# Patient Record
Sex: Male | Born: 1950
Health system: Southern US, Community
[De-identification: ages and names within clinical notes are randomized; demographics above are authoritative.]

## PROBLEM LIST (undated history)

## (undated) DIAGNOSIS — S6720XA Crushing injury of unspecified hand, initial encounter: Secondary | ICD-10-CM

## (undated) DIAGNOSIS — K602 Anal fissure, unspecified: Secondary | ICD-10-CM

## (undated) DIAGNOSIS — E785 Hyperlipidemia, unspecified: Secondary | ICD-10-CM

## (undated) DIAGNOSIS — I10 Essential (primary) hypertension: Secondary | ICD-10-CM

## (undated) DIAGNOSIS — I509 Heart failure, unspecified: Secondary | ICD-10-CM

## (undated) DIAGNOSIS — D234 Other benign neoplasm of skin of scalp and neck: Secondary | ICD-10-CM

## (undated) DIAGNOSIS — E119 Type 2 diabetes mellitus without complications: Secondary | ICD-10-CM

## (undated) DIAGNOSIS — W3400XA Accidental discharge from unspecified firearms or gun, initial encounter: Secondary | ICD-10-CM

## (undated) DIAGNOSIS — E669 Obesity, unspecified: Secondary | ICD-10-CM

## (undated) HISTORY — PX: TONSILLECTOMY: SUR1361

## (undated) HISTORY — DX: Crushing injury of unspecified hand, initial encounter: S67.20XA

## (undated) HISTORY — DX: Accidental discharge from unspecified firearms or gun, initial encounter: W34.00XA

## (undated) HISTORY — DX: Other benign neoplasm of skin of scalp and neck: D23.4

## (undated) HISTORY — DX: Hyperlipidemia, unspecified: E78.5

## (undated) HISTORY — DX: Anal fissure, unspecified: K60.2

## (undated) HISTORY — DX: Obesity, unspecified: E66.9

---
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
## 2001-04-06 ENCOUNTER — Encounter: Payer: Self-pay | Admitting: Emergency Medicine

## 2001-04-06 ENCOUNTER — Emergency Department (HOSPITAL_COMMUNITY): Admission: EM | Admit: 2001-04-06 | Discharge: 2001-04-06 | Payer: Self-pay | Admitting: Emergency Medicine

## 2012-05-14 ENCOUNTER — Encounter (HOSPITAL_COMMUNITY): Payer: Self-pay | Admitting: Emergency Medicine

## 2012-05-14 ENCOUNTER — Emergency Department (HOSPITAL_COMMUNITY): Payer: BC Managed Care – PPO

## 2012-05-14 ENCOUNTER — Inpatient Hospital Stay (HOSPITAL_COMMUNITY)
Admission: EM | Admit: 2012-05-14 | Discharge: 2012-05-15 | DRG: 127 | Disposition: A | Discharge status: Home / Self Care | Payer: BC Managed Care – PPO | Attending: Family Medicine | Admitting: Family Medicine

## 2012-05-14 ENCOUNTER — Other Ambulatory Visit: Payer: Self-pay

## 2012-05-14 DIAGNOSIS — I5021 Acute systolic (congestive) heart failure: Secondary | ICD-10-CM

## 2012-05-14 DIAGNOSIS — F17200 Nicotine dependence, unspecified, uncomplicated: Secondary | ICD-10-CM | POA: Diagnosis present

## 2012-05-14 DIAGNOSIS — E785 Hyperlipidemia, unspecified: Secondary | ICD-10-CM | POA: Diagnosis present

## 2012-05-14 DIAGNOSIS — I509 Heart failure, unspecified: Principal | ICD-10-CM | POA: Diagnosis present

## 2012-05-14 DIAGNOSIS — R3589 Other polyuria: Secondary | ICD-10-CM | POA: Diagnosis not present

## 2012-05-14 DIAGNOSIS — Z7982 Long term (current) use of aspirin: Secondary | ICD-10-CM

## 2012-05-14 DIAGNOSIS — J4489 Other specified chronic obstructive pulmonary disease: Secondary | ICD-10-CM | POA: Diagnosis present

## 2012-05-14 DIAGNOSIS — E669 Obesity, unspecified: Secondary | ICD-10-CM | POA: Diagnosis present

## 2012-05-14 DIAGNOSIS — Z6836 Body mass index (BMI) 36.0-36.9, adult: Secondary | ICD-10-CM

## 2012-05-14 DIAGNOSIS — R7309 Other abnormal glucose: Secondary | ICD-10-CM | POA: Diagnosis not present

## 2012-05-14 DIAGNOSIS — I10 Essential (primary) hypertension: Secondary | ICD-10-CM | POA: Diagnosis present

## 2012-05-14 DIAGNOSIS — J449 Chronic obstructive pulmonary disease, unspecified: Secondary | ICD-10-CM | POA: Diagnosis present

## 2012-05-14 DIAGNOSIS — Z79899 Other long term (current) drug therapy: Secondary | ICD-10-CM

## 2012-05-14 DIAGNOSIS — G4733 Obstructive sleep apnea (adult) (pediatric): Secondary | ICD-10-CM | POA: Diagnosis present

## 2012-05-14 DIAGNOSIS — R358 Other polyuria: Secondary | ICD-10-CM | POA: Diagnosis not present

## 2012-05-14 HISTORY — DX: Type 2 diabetes mellitus without complications: E11.9

## 2012-05-14 HISTORY — DX: Essential (primary) hypertension: I10

## 2012-05-14 HISTORY — DX: Heart failure, unspecified: I50.9

## 2012-05-14 LAB — CBC WITH DIFFERENTIAL/PLATELET
Basophils Absolute: 0 10*3/uL (ref 0.0–0.1)
Basophils Relative: 0 % (ref 0–1)
Eosinophils Absolute: 0 10*3/uL (ref 0.0–0.7)
Eosinophils Relative: 0 % (ref 0–5)
HCT: 42.6 % (ref 39.0–52.0)
Hemoglobin: 14.2 g/dL (ref 13.0–17.0)
Lymphocytes Relative: 19 % (ref 12–46)
Lymphs Abs: 1.1 10*3/uL (ref 0.7–4.0)
MCH: 29 pg (ref 26.0–34.0)
MCHC: 33.3 g/dL (ref 30.0–36.0)
MCV: 87.1 fL (ref 78.0–100.0)
Monocytes Absolute: 0.6 10*3/uL (ref 0.1–1.0)
Monocytes Relative: 11 % (ref 3–12)
Neutro Abs: 3.9 10*3/uL (ref 1.7–7.7)
Neutrophils Relative %: 70 % (ref 43–77)
Platelets: 193 10*3/uL (ref 150–400)
RBC: 4.89 MIL/uL (ref 4.22–5.81)
RDW: 13.6 % (ref 11.5–15.5)
WBC: 5.6 10*3/uL (ref 4.0–10.5)

## 2012-05-14 LAB — TROPONIN I: Troponin I: <0.3 ng/mL (ref <0.30)

## 2012-05-14 LAB — CBC
HCT: 40.8 % (ref 39.0–52.0)
MCHC: 33.1 g/dL (ref 30.0–36.0)
MCV: 86.6 fL (ref 78.0–100.0)
Platelets: 182 10*3/uL (ref 150–400)
RDW: 13.5 % (ref 11.5–15.5)
WBC: 5.2 10*3/uL (ref 4.0–10.5)

## 2012-05-14 LAB — BASIC METABOLIC PANEL
BUN: 17 mg/dL (ref 6–23)
CO2: 26 mEq/L (ref 19–32)
Calcium: 9.3 mg/dL (ref 8.4–10.5)
Chloride: 96 mEq/L (ref 96–112)
Creatinine, Ser: 1.03 mg/dL (ref 0.50–1.35)
GFR calc Af Amer: 89 mL/min — ABNORMAL LOW (ref >90)
GFR calc non Af Amer: 77 mL/min — ABNORMAL LOW (ref >90)
Glucose, Bld: 130 mg/dL — ABNORMAL HIGH (ref 70–99)
Potassium: 4.6 mEq/L (ref 3.5–5.1)
Sodium: 136 mEq/L (ref 135–145)

## 2012-05-14 LAB — CREATININE, SERUM: GFR calc Af Amer: 85 mL/min — ABNORMAL LOW (ref >90)

## 2012-05-14 LAB — PRO B NATRIURETIC PEPTIDE: Pro B Natriuretic peptide (BNP): 116.7 pg/mL (ref 0–125)

## 2012-05-14 MED ORDER — ASPIRIN EC 81 MG PO TBEC
81.0000 mg | DELAYED_RELEASE_TABLET | Freq: Every day | ORAL | Status: DC
  Administered 2012-05-14 – 2012-05-15 (×2): 81 mg via ORAL
  Filled 2012-05-14 (×2): qty 1

## 2012-05-14 MED ORDER — SODIUM CHLORIDE 0.9 % IJ SOLN
3.0000 mL | Freq: Two times a day (BID) | INTRAMUSCULAR | Status: DC
  Administered 2012-05-14 – 2012-05-15 (×2): 3 mL via INTRAVENOUS

## 2012-05-14 MED ORDER — ONDANSETRON HCL 4 MG/2ML IJ SOLN: 4.0000 mg | Freq: Four times a day (QID) | INTRAMUSCULAR | Status: DC | PRN

## 2012-05-14 MED ORDER — SODIUM CHLORIDE 0.9 % IV SOLN: 250.0000 mL | INTRAVENOUS | Status: DC | PRN

## 2012-05-14 MED ORDER — HEPARIN SODIUM (PORCINE) 5000 UNIT/ML IJ SOLN
5000.0000 [IU] | Freq: Three times a day (TID) | INTRAMUSCULAR | Status: DC
  Administered 2012-05-14 – 2012-05-15 (×3): 5000 [IU] via SUBCUTANEOUS
  Filled 2012-05-14 (×5): qty 1

## 2012-05-14 MED ORDER — ACETAMINOPHEN 325 MG PO TABS
650.0000 mg | ORAL_TABLET | ORAL | Status: DC | PRN
  Administered 2012-05-14: 650 mg via ORAL
  Filled 2012-05-14: qty 2

## 2012-05-14 MED ORDER — FUROSEMIDE 10 MG/ML IJ SOLN
40.0000 mg | Freq: Once | INTRAMUSCULAR | Status: AC
  Administered 2012-05-14: 40 mg via INTRAVENOUS
  Filled 2012-05-14: qty 4

## 2012-05-14 MED ORDER — SODIUM CHLORIDE 0.9 % IJ SOLN: 3.0000 mL | INTRAMUSCULAR | Status: DC | PRN

## 2012-05-14 MED ORDER — LISINOPRIL 5 MG PO TABS
5.0000 mg | ORAL_TABLET | Freq: Every day | ORAL | Status: DC
  Administered 2012-05-14: 5 mg via ORAL
  Filled 2012-05-14 (×2): qty 1

## 2012-05-14 MED ORDER — FUROSEMIDE 40 MG PO TABS
40.0000 mg | ORAL_TABLET | Freq: Three times a day (TID) | ORAL | Status: AC
  Administered 2012-05-14 – 2012-05-15 (×2): 40 mg via ORAL
  Filled 2012-05-14 (×2): qty 1

## 2012-05-14 MED ORDER — POTASSIUM CHLORIDE CRYS ER 20 MEQ PO TBCR
20.0000 meq | EXTENDED_RELEASE_TABLET | Freq: Once | ORAL | Status: AC
  Administered 2012-05-14: 20 meq via ORAL
  Filled 2012-05-14: qty 1

## 2012-05-14 NOTE — H&P (Signed)
FMTS Attending Admit Note  Patient seen and examined by me in ED.  I have discussed with Dr Clinton Sawyer and I agree with his assessment and plan, with the following additions: Briefly, a 61yoM with scant interface with medical community, who presents with worsening dyspnea since last Saturday (Jan 4), at which time he had a cough and coryza.  Noticed worsening dyspnea with mild exertion that accentuated on Monday, Jan 6th, and is new for him.  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  Denies objective fevers, but says he has had chills last weekend (now resolved).  He also states he has had markedly increased urinary frequency and sensation of incomplete emptying for the past 4 days, without dysuria or changes in the appearance or odor of his urine. On exam he is a very pleasant gentleman, breathing comfortably on room air, becoming slightly winded with extended conversation.  No distress.  Neck is supple,without JVD or cervical adenopathy.  Regular heart sounds without murmurs PULM trace bibasilar rales appreciated, no wheezes LEs: minimal bilateral pitting edema in shins of both legs.  Feet are warm; gnawed appearance to toenails on both feet. No open lesions on feet, with grossly intact sensation.   Assess/Plan: Patient with likely initial presentation of dyspnea secondary to CHF exacerbation. His ECG suggests LVH, which supports a diagnosis of HTN.  I agree with diuresis and recording strict I/Os;ECHO to be done this admission. Agree with addition of ACEI on this admission. Repeat CXR 2-view in am if not making marked improvement in spite of diuresis.  Given his description of polyuria and incomplete emptying, it would be reasonable to check a UA and consider DRE to evaluate for possible prostatic enlargement.  We discussed smoking cessation strategies and patient seems eager to quit. Of note, his wife  does not smoke and cannot stand the smell of cigarettes.  He can cite many reasons why he would like to quit.   Paula Compton, MD

## 2012-05-14 NOTE — ED Notes (Signed)
Pt taken to radiology

## 2012-05-14 NOTE — ED Notes (Addendum)
Onset 5 days cold symptoms and 3 days ago developed shortness of breath with productive cough clear sputum and today patient blew nose with scant blood and sputum clear.  States general soreness achy 1/10. Seen at urgent care chest x-ray taken and patient brought CD sent to ED to evaluate SOB and rule out CHF / cardiomegaly.

## 2012-05-14 NOTE — ED Provider Notes (Signed)
History     CSN: [id number]  Arrival date & time 05/14/12  1003   First MD Initiated Contact with Patient 05/14/12 1023      Chief Complaint  Patient presents with  . Shortness of Breath    (Consider location/radiation/quality/duration/timing/severity/associated sxs/prior treatment) HPI Patient presents to the emergency department today as referred from an urgent care with worsening dyspnea on exertion for 3 days. He noticed that walking to the bathroom causes him to breathe more heavily and it takes him longer to catch his breath. He has an occasional cough and an irritated throat. Reports feeling feverish and having chills, but no measured temperatures. He also reports polyuria, which he attributes to his increased fluid intake. He has not had an appetite since the beginning of his illness. He has not had regular medical care because he tries to avoid doctors and sticks to natural remedies as often as possible. His wife was sick 5 days ago with a cough. He has been smoking on and off since his 36s.   Past Medical History  Diagnosis Date  . Hypertension     History reviewed. No pertinent past surgical history.  No family history on file.  History  Substance Use Topics  . Smoking status: Light Tobacco Smoker  . Smokeless tobacco: Not on file  . Alcohol Use: No      Review of Systems All other systems negative except as documented in the HPI. All pertinent positives and negatives as reviewed in the HPI.   Allergies  Review of patient's allergies indicates no known allergies.  Home Medications   Current Outpatient Rx  Name  Route  Sig  Dispense  Refill  . GOODYS BODY PAIN PO   Oral   Take 1 packet by mouth 3 (three) times daily as needed. For pain         . ALKA-SELTZER PLS ALLERGY & CGH PO   Oral   Take 2 capsules by mouth every 6 (six) hours as needed. For cold symptoms           BP 147/96  Pulse 89  Temp 99.1 F (37.3 C) (Oral)  Resp 22  SpO2  100%  Physical Exam  Nursing note and vitals reviewed. Constitutional: He is oriented to person, place, and time. He appears well-developed and well-nourished. No distress.  HENT:  Mouth/Throat: No oropharyngeal exudate, posterior oropharyngeal edema or posterior oropharyngeal erythema.  Cardiovascular: Regular rhythm.  Exam reveals distant heart sounds.   Pulmonary/Chest: Effort normal. No respiratory distress. He has rales in the right lower field, the left upper field and the left lower field.  Abdominal: Soft. Normal appearance. There is no tenderness.  Neurological: He is alert and oriented to person, place, and time.  Skin: Skin is warm and dry. He is not diaphoretic.    ED Course  Procedures (including critical care time)  Labs Reviewed  BASIC METABOLIC PANEL - Abnormal; Notable for the following:    Glucose, Bld 130 (*)     GFR calc non Af Amer 77 (*)     GFR calc Af Amer 89 (*)     All other components within normal limits  CBC WITH DIFFERENTIAL  PRO B NATRIURETIC PEPTIDE  TROPONIN I   Dg Chest 2 View  05/14/2012  *RADIOLOGY REPORT*  Clinical Data: Cough and short of breath  CHEST - 2 VIEW  Comparison: None.  Findings: Mild cardiomegaly.  Heterogeneous opacities with a central distribution are present.  There is more disease  within the left lung.  No pleural effusion.  No pneumothorax.  IMPRESSION: Cardiomegaly and bilateral pulmonary opacities as described most consistent with pulmonary edema.  An inflammatory process is not excluded.   Original Report Authenticated By: Jolaine Click, M.D.    Patient will be admitted to the hospital by the family practice residents for CHF.  Patient has been stable here in the emergency department.    MDM  MDM Reviewed: nursing note, vitals and previous chart Interpretation: labs and x-ray Consults: admitting MD            Carlyle Dolly, PA-C 05/15/12 1505

## 2012-05-14 NOTE — H&P (Signed)
Family Medicine Teaching [facility] Admission History and Physical  Patient name: Kyle Ballard Medical record number: [id number] Date of birth: 1950-09-20 Age: 62 y.o. Gender: male  Primary Care Provider:   Chief Complaint: Shortness of breath History of Present Illness: Kyle Ballard is a 62 y.o. year old male presenting with four days of shortness of breath.  He had cold symptoms starting one week ago, including cough, chills and subjective fever, following contact with his wife and son who both had similar symptoms.  His chills and fever have improved, but he complains of persistent dyspnea since that time.  He has more trouble breathing on exertion and states that his cough is worse in the morning.  He denies any difficulty breathing when lying flat and denies waking up short of breath.  He has not noticed any leg or abdominal swelling.  His cough has been nonproductive.  He denies any chest pain, history of asthma.  He has taken Alka Seltzer and Goodys headache powder with little relief.  He has a significant smoking history for the last 40 years and currently smokes approx 1/3 PPD.  He has not received a flu vaccine this year.   Of note, he does not have a PCP and has not been to a doctor in many years.  He states that he has been found to have elevated blood sugar and elevated blood pressure in the past, but has not been treated for either on a regular basis.  He does occasionally check his blood sugar at home and found it to be in the mid-130s this week.  There is no problem list on file for this patient.  Past Medical History: Past Medical History  Diagnosis Date  . Hypertension     Past Surgical History: History reviewed. No pertinent past surgical history.  Social History: Pt is married and has 2 sons.  He is currently employed with [employer] and works as the Designer, multimedia.  He has previously been employed at [employer] and was formerly in [employer].   As per HPI, he smokes < 1/2 PPD.  He occasionally drinks EtOH and denies other illicit drugs.  Family History: Mother with CHF in her 19s - deceased from cardiac cause.  No other significant family history.  Allergies: No Known Allergies  No current facility-administered medications for this encounter.   Current Outpatient Prescriptions  Medication Sig Dispense Refill  . Aspirin-Acetaminophen (GOODYS BODY PAIN PO) Take 1 packet by mouth 3 (three) times daily as needed. For pain      . Phenyleph-Doxylamine-DM-APAP (ALKA-SELTZER PLS ALLERGY & CGH PO) Take 2 capsules by mouth every 6 (six) hours as needed. For cold symptoms       Review Of Systems: Per HPI with the following additions: none Otherwise 12 point review of systems was performed and was unremarkable.  Physical Exam: Temp:  [99.1 F (37.3 C)] 99.1 F (37.3 C) (01/09 1009) Pulse Rate:  [89] 89  (01/09 1009) Resp:  [22] 22  (01/09 1009) BP: (147)/(96) 147/96 mmHg (01/09 1009) SpO2:  [100 %] 100 % (01/09 1009)   General: Pleasant man, appears stated age, sitting up in room, NAD HEENT: PEERL, mucous membranes moist, tympanic membranes clear biterally, no tenderness to percussion of maxillary or frontal sinuses, nares without drainage Neck: no lypmhadenopathy, no JVD, negative abdominojugular reflex Heart: regular rhythm with increased rate, no murmurs/rubs or gallops Lungs: poor air movement at bases bilaterally, some crackles at bilateral bases, no accessory muscle  use, normal work of breathing Abdomen: obese with normal bowel sounds, nontender, nondistended, no masses, no organomegaly Extremities: +1 pitting edema in bilateral lower legs, no lesions, non tender Skin: no lesions or rashes noted,  Neurology: mental status and speech normal, A&Ox3, no focal deficits on gross exam  Labs and Imaging:  Results for orders placed during the hospital encounter of 05/14/12 (from the past 24 hour(s))  PRO B NATRIURETIC PEPTIDE      Status: Normal   Collection Time   05/14/12 10:24 AM      Component Value Range   Pro B Natriuretic peptide (BNP) 116.7  0 - 125 pg/mL  TROPONIN I     Status: Normal   Collection Time   05/14/12 10:24 AM      Component Value Range   Troponin I <0.30  <0.30 ng/mL  CBC WITH DIFFERENTIAL     Status: Normal   Collection Time   05/14/12 10:26 AM      Component Value Range   WBC 5.6  4.0 - 10.5 K/uL   RBC 4.89  4.22 - 5.81 MIL/uL   Hemoglobin 14.2  13.0 - 17.0 g/dL   HCT 16.1  09.6 - 04.5 %   MCV 87.1  78.0 - 100.0 fL   MCH 29.0  26.0 - 34.0 pg   MCHC 33.3  30.0 - 36.0 g/dL   RDW 40.9  81.1 - 91.4 %   Platelets 193  150 - 400 K/uL   Neutrophils Relative 70  43 - 77 %   Neutro Abs 3.9  1.7 - 7.7 K/uL   Lymphocytes Relative 19  12 - 46 %   Lymphs Abs 1.1  0.7 - 4.0 K/uL   Monocytes Relative 11  3 - 12 %   Monocytes Absolute 0.6  0.1 - 1.0 K/uL   Eosinophils Relative 0  0 - 5 %   Eosinophils Absolute 0.0  0.0 - 0.7 K/uL   Basophils Relative 0  0 - 1 %   Basophils Absolute 0.0  0.0 - 0.1 K/uL  BASIC METABOLIC PANEL     Status: Abnormal   Collection Time   05/14/12 10:26 AM      Component Value Range   Sodium 136  135 - 145 mEq/L   Potassium 4.6  3.5 - 5.1 mEq/L   Chloride 96  96 - 112 mEq/L   CO2 26  19 - 32 mEq/L   Glucose, Bld 130 (*) 70 - 99 mg/dL   BUN 17  6 - 23 mg/dL   Creatinine, Ser 7.82  0.50 - 1.35 mg/dL   Calcium 9.3  8.4 - 95.6 mg/dL   GFR calc non Af Amer 77 (*) >90 mL/min   GFR calc Af Amer 89 (*) >90 mL/min    Dg Chest 2 View  05/14/2012  *RADIOLOGY REPORT*  Clinical Data: Cough and short of breath  CHEST - 2 VIEW  Comparison: None.  Findings: Mild cardiomegaly.  Heterogeneous opacities with a central distribution are present.  There is more disease within the left lung.  No pleural effusion.  No pneumothorax.  IMPRESSION: Cardiomegaly and bilateral pulmonary opacities as described most consistent with pulmonary edema.  An inflammatory process is not excluded.   Original  Report Authenticated By: Jolaine Click, M.D.       Assessment and Plan: Kyle Ballard is a 62 y.o. year old male with unknown PMH presenting with four days of persistent dyspnea and cough.  Concern for CHF vs. COPD vs.  Interstitial lung disease vs. Viral syndrome.    CV # CHF:  Kyle Ballard likely has some component of CHF based on his presentation and supported by evidence of pulmonary edema on chest x-ray.  He has what appears to be a long history of untreated hypertension as well and has significant cardiomegaly on CXR.  Of note, his proBNP is normal.   His troponin is also negative x1 and he has no symptoms of ACS.  Has received 40 mg IV Lasix x 1 in ED.  - Start 40 mg p.o. Lasix x 2 today.  - Strict I&O  - Start lisinopril in setting of potential undiagnosed DM2, HTN and CHF  - Daily weights  - TTE while inpatient  RESP # COPD vs. Pulmonary Edema vs. Interstitial lung disease:    - PRN O2  - Repeat CXR in AM   FENGI: Heart healthy diet  PPX:  Heparin SQ 5000 U tid  DISPO:  Home when clinical picture improves  CODE: Full  WOOD, LAUREN  MSIV 05/14/2012, 2:12 PM  Resident H&P  HPI: 62 year old obese male with 3 day history of shortness of breath preceded by viral upper respiratory illness. Denies swelling on extremities, orthopnea, PND, and known heart failure. Has not been to doctor consistently in years. Presented to urgent care today and was told that he has fluid on his lungs and told to go to the ED for evaluation of his heart. He denies chest pain. He has a > 30 pk year history of smoking with no known COPD. 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   PMH: see above PSH: see above Fam Hx: see above Social: See above Meds: none Allergies: no known allergies   ROS: negative unless stated in HPI  PE:  BP 140/91  Pulse 105  Temp 101 F (38.3 C) (Oral)   Resp 24  Ht 5\' 10"  (1.778 m)  Wt 256 lb 6.3 oz (116.3 kg)  BMI 36.79 kg/m2  SpO2 93% Gen: elderly AAM, obese, non ill appearing, pleasant and conversant  HEENT: NCAT, PERRLA, no lymphadenopathy Lung: diffuse crackles, poor air movement, no wheezes or rhonchi CV: RRR, numerous PVC's Abd: obese, no anasarca, normal bowel sounds Extr: 1+ bilat edema, no calf tenderness   Studies:  Trop < 0.3 Pro BNP WNL EKG: no stemi CXR: cardiomegaly and pulm vasc congestion with patchy bilat infiltrates  A/P:  62 year old M with no known PMH because he does not go to the doctor, but presents with symptoms and signs consistent with CHF. However, the normal Pro BNP indicates another possible etiology. DDX includes obstructive disease like COPD from smoking or restrictive disease like sarcoid that was worsened by the viral illness. Also, possible is pulmonary embolism, but modified wells criteria is 1.   # Dyspnea - broad ddx;  - start down CHF pathway with diuresis, echo, repeat CXR in AM - Strict I/O, daily weights - Consider steroids and breathing treatment if starts wheezing and develops hyopxemia  # HTN - start low dose ACE-I since patient has HTN, likely DM and CHF as well  # Gen Health Screening - obtain TSH, A1C, and Lipid profile since he doesn't go to MD  Si Raider. Clinton Sawyer, MD, MBA 05/14/2012, 6:16 PM Family Medicine Resident, PGY-2 [phone] pager

## 2012-05-15 ENCOUNTER — Inpatient Hospital Stay (HOSPITAL_COMMUNITY): Payer: BC Managed Care – PPO

## 2012-05-15 DIAGNOSIS — I517 Cardiomegaly: Secondary | ICD-10-CM

## 2012-05-15 LAB — BASIC METABOLIC PANEL
BUN: 21 mg/dL (ref 6–23)
CO2: 22 mEq/L (ref 19–32)
Calcium: 9.3 mg/dL (ref 8.4–10.5)
Creatinine, Ser: 1.06 mg/dL (ref 0.50–1.35)
GFR calc Af Amer: 86 mL/min — ABNORMAL LOW (ref >90)

## 2012-05-15 LAB — LIPID PANEL
Cholesterol: 159 mg/dL (ref 0–200)
HDL: 21 mg/dL — ABNORMAL LOW (ref >39)
Total CHOL/HDL Ratio: 7.6 RATIO
Triglycerides: 179 mg/dL — ABNORMAL HIGH (ref <150)
VLDL: 36 mg/dL (ref 0–40)

## 2012-05-15 LAB — HEMOGLOBIN A1C: Mean Plasma Glucose: 183 mg/dL — ABNORMAL HIGH (ref <117)

## 2012-05-15 MED ORDER — ATORVASTATIN CALCIUM 40 MG PO TABS
40.0000 mg | ORAL_TABLET | Freq: Every day | ORAL | Status: DC
  Filled 2012-05-15: qty 1

## 2012-05-15 MED ORDER — LISINOPRIL 10 MG PO TABS: 10.0000 mg | ORAL_TABLET | Freq: Every day | ORAL | Status: DC

## 2012-05-15 MED ORDER — FUROSEMIDE 20 MG PO TABS: 20.0000 mg | ORAL_TABLET | Freq: Two times a day (BID) | ORAL | Status: DC

## 2012-05-15 MED ORDER — FUROSEMIDE 40 MG PO TABS
40.0000 mg | ORAL_TABLET | Freq: Once | ORAL | Status: AC
  Administered 2012-05-15: 40 mg via ORAL
  Filled 2012-05-15: qty 1

## 2012-05-15 MED ORDER — ASPIRIN 81 MG PO TBEC: 81.0000 mg | DELAYED_RELEASE_TABLET | Freq: Every day | ORAL | Status: AC

## 2012-05-15 MED ORDER — LISINOPRIL 10 MG PO TABS
10.0000 mg | ORAL_TABLET | Freq: Every day | ORAL | Status: DC
  Administered 2012-05-15: 10 mg via ORAL
  Filled 2012-05-15: qty 1

## 2012-05-15 MED ORDER — ATORVASTATIN CALCIUM 40 MG PO TABS: 40.0000 mg | ORAL_TABLET | Freq: Every day | ORAL | Status: DC

## 2012-05-15 NOTE — Progress Notes (Signed)
FMTS Attending Note Patient seen and examined by me, discussed with resident team and agree with plan. Patient for ECHO in light of his dyspnea, which is mildly improved this morning at time of my exam.  Not requiring oxygen supplementation.   Has been able to ambulate around room without worsening dyspnea.  Paula Compton, MD

## 2012-05-15 NOTE — Progress Notes (Signed)
Utilization review completed.  

## 2012-05-15 NOTE — Progress Notes (Signed)
Daily Progress Note  Family Medicine Resident Pager [phone]  Patient name: Elzy Tomasello Medical record number: [id number] Date of birth: 26-Jul-1950 Age: 62 y.o. Gender: male  Subjective: No acute events overnight.  Pt reports mild improvement in breathing but still feels that he is not at baseline.  His cough has improved significantly overnight, he still complains of a "tickle" in his throat causing occasional cough.  His appetite is still diminished, although somewhat improved from the past week.  No recurrence of chills/fever or other viral symptoms.  He complains of a mild headache this morning.  Objective: Vital signs in last 24 hours: Temp:  [98.7 F (37.1 C)-101 F (38.3 C)] 98.7 F (37.1 C) (01/10 0449) Pulse Rate:  [65-113] 99  (01/10 0449) Resp:  [19-29] 22  (01/10 0449) BP: (112-147)/(61-96) 141/95 mmHg (01/10 0449) SpO2:  [92 %-100 %] 93 % (01/10 0449) Weight:  [252 lb 14.4 oz (114.715 kg)-256 lb 6.3 oz (116.3 kg)] 252 lb 14.4 oz (114.715 kg) (01/10 0449) Weight change:  - 4 lbs Last BM Date: 05/14/12  Intake/Output from previous day: 01/09 0701 - 01/10 0700 In: 120 [P.O.:120] Out: 1100 [Urine:1100] Intake/Output this shift: Total I/O In: -  Out: 200 [Urine:200]  PE:  BP 141/95, P 99,  T 98.7, R 22, SpO2 93%  Gen: elderly AAM, obese, well appearing, NAD HEENT: NCAT, PERRLA, no lymphadenopathy  Lung: diminished air movement and crackles at bilateral bases, normal work of breathing CV: RRR, numerous PVC's, no murmurs/gallops Abd: obese, normal bowel sounds, nontender, no fluid wave Extr: trace bilat edema, no calf tenderness    Lab Results:  Basename 05/14/12 1757 05/14/12 1026  WBC 5.2 5.6  HGB 13.5 14.2  HCT 40.8 42.6  PLT 182 193   BMET  Basename 05/15/12 0555 05/14/12 1757 05/14/12 1026  NA 132* -- 136  K 3.7 -- 4.6  CL 94* -- 96  CO2 22 -- 26  GLUCOSE 147* -- 130*  BUN 21 -- 17  CREATININE 1.06 1.07 --  CALCIUM 9.3 -- 9.3     Studies/Results: Dg Chest 2 View  05/15/2012  *RADIOLOGY REPORT*  Clinical Data: Shortness of breath, dyspnea, pulmonary infiltrates, history CHF, hypertension, diabetes  CHEST - 2 VIEW  Comparison: 05/14/2012  Findings: Enlargement of cardiac silhouette with pulmonary vascular congestion. Minimal perihilar infiltrate question edema and CHF. Scattered atelectasis notably in left mid lung and at right base. No gross pleural effusion or pneumothorax. Bones unremarkable.  IMPRESSION: Enlargement of cardiac silhouette with pulmonary vascular congestion and probable mild persistent pulmonary edema/failure. Scattered atelectasis.   Original Report Authenticated By: Ulyses Southward, M.D.    Dg Chest 2 View  05/14/2012  *RADIOLOGY REPORT*  Clinical Data: Cough and short of breath  CHEST - 2 VIEW  Comparison: None.  Findings: Mild cardiomegaly.  Heterogeneous opacities with a central distribution are present.  There is more disease within the left lung.  No pleural effusion.  No pneumothorax.  IMPRESSION: Cardiomegaly and bilateral pulmonary opacities as described most consistent with pulmonary edema.  An inflammatory process is not excluded.   Original Report Authenticated By: Jolaine Click, M.D.     Medications:  Scheduled:   . aspirin EC  81 mg Oral Daily  . atorvastatin  40 mg Oral q1800  . furosemide  40 mg Oral Once  . heparin  5,000 Units Subcutaneous Q8H  . lisinopril  10 mg Oral Daily  . sodium chloride  3 mL Intravenous Q12H   Continuous:   Assessment/Plan:  Mr. Starks is a 33 year old man with unclear PMH who presents with four days of dyspnea and concern for CHF.  He is mildly improved today and will continue with CHF workup as is currently leading diagnosis, although dyspnea may be multifactorial.  CV  # CHF: Pt likely has some component of CHF based on his presentation and supported by CXR. Repeat CXR this morning showed cardiomegaly, pulmonary vascular congestion indicative of CHF.  He  has diuresed well overnight and is -1.8 L on Lasix 40 mg IV x1 and 2 doses of 40 mg po.  Started lisinopril 5 mg daily in setting of likely HTN, DM and CHF. Weight decreased from 256 in ED to 252 this AM. - Continue 40 mg p.o. Lasix x 2 today.  - Strict I&O, daily weights - Continue lisinopril 5 mg in setting of potential undiagnosed DM2, HTN and CHF  - Repeat 20 mEq KCl today in setting of decreased K+ and continued diuresis - F/U TTE results - TSH pending  # CAD:  Framingham risk calculator places Mr. Jyl Heinz at approx 40% to 65% 10 year risk compared with 10% average for age matched cohort.  - Continue lisinopril for HTN  - Continue ASA 81 mg daily  - Awaiting A1c for possible DM2 diagnosis and further risk stratification  - Start lipitor 40 mg in setting of elevated risk  RESP  # COPD vs. Pulmonary Edema vs. Interstitial lung disease: Based on weight loss and good diuresis that dyspnea has at least a component of CHF/volume overload.  Possible that his dyspnea is multifactorial considering his smoking and exposure history.  He has not been hypoxemic at all during this admission. - PRN O2 - Consider screening low-dose CT w/ smoking history and unclear etiology of dyspnea based on CXR - Recommend outpatient PFTs if dyspnea not much more improved by discharge  FENGI: Heart healthy diet   PPX: Heparin SQ 5000 U tid   DISPO: Consider home today pending results of TTE.  Could complete screening CT and further workup as outpatient since he has [facility].  Need to set up with PCP.  CODE: Full   LOS: 1 day   Cathlyn Parsons 05/15/2012, 8:30 AM  RESIDENT NOTE  Overnight/Subjective:  Denies SOB, has not been walking the halls; no fluid on ankles; has been   Temp:  [98.7 F (37.1 C)-101 F (38.3 C)] 98.7 F (37.1 C) (01/10 0449) Pulse Rate:  [65-113] 99  (01/10 0449) Resp:  [19-29] 22  (01/10 0449) BP: (112-141)/(61-96) 141/95 mmHg (01/10 0449) SpO2:  [92 %-93 %] 93 % (01/10  0449) Weight:  [252 lb 14.4 oz (114.715 kg)-256 lb 6.3 oz (116.3 kg)] 252 lb 14.4 oz (114.715 kg) (01/10 0449)   Intake/Output Summary (Last 24 hours) at 05/15/12 1146 Last data filed at 05/15/12 1102  Gross per 24 hour  Intake    360 ml  Output   1600 ml  Net  -1240 ml    Gen: well appearing, obese black male, non distressed Pulm: Lungs with mild bibasilar crackles, improved air movement compared to yesterday Cv: RRR, no murmurs Extr: no edema Rectal: prostate normal size without tenderness   A/P: 62 year old M w/ newly diagnosed HTN, HLD who has dyspnea after viral URI that is a presumed CHF exacerbation with possible COPD or interstitial lung disease component.   #Dyspea - Lasix 80 mg po - F/u Echo, TSH  # HTN - increase lisinopril to 10 mg   #  HLD - Simvastatin 40mg    #Hyperglycemia - f/u A1C  #Polyruria - Check UA  #Dispo - Likely discharge this afternoon given improving clinical picture with close follow up  Si Raider. Clinton Sawyer, MD, MBA 05/15/2012, 11:52 AM Family Medicine Resident, PGY-2 [phone] pager

## 2012-05-15 NOTE — Progress Notes (Signed)
  Echocardiogram 2D Echocardiogram has been performed.  Ellender Hose A 05/15/2012, 11:48 AM

## 2012-05-15 NOTE — Discharge Summary (Addendum)
Physician Discharge Summary  Patient ID: Kyle Ballard MRN: [id number] DOB/AGE: Jun 09, 1950 62 y.o.  Admit date: 05/14/2012 Discharge date: 05/15/2012  Admission Diagnoses: Dyspnea   Discharge Diagnoses: CHF  Hospital Course:   Kyle Ballard is a 62 y.o. year old male presenting with four days of persistent shortness of breath with exertion and nonproductive cough following an initial viral syndrome.  He presented to the ED at [facility] after being evaluated at a local urgent care where the provider had concern for an acute presentation of CHF.  Of note, he does not have a PCP and has not been to a doctor in many years.   In the ED, Kyle Ballard received Lasix 40 mg x 1 and a CXR which showed cardoimegaly and perihilar infiltrates consistent with pulmonary edema.  He denied chest pain, leg swelling or difficulty breathing when supine.  He does have a > 25 pack year smoking history and currently smokes approx 1/2 PPD. He states that he has had episodes of high blood pressure and elevated blood glucose, but does not have a diagnosis of either.  His labs in the ED were mostly remarkable for a negative proBNP at 116.7 and a negative troponin x1. He was breathing comfortably and maintained good O2 sats on RA.  He was seen by the [facility] Medicine team and admitted to the floor.  He was started on Lasix 40 mg p.o. x2 and 20 mEQ KCl.  A TTE was ordered to ascertain a diagnosis of CHF.  Additionally, in light of his poor medical care to date, a lipid panel, hemoglobin A1C and TSH were ordered.   Overnight, Kyle. Ballard diuresed well with 1.2 L output.  He reported improved breathing and otherwise felt well.  He was given 40 mg p.o. Lasix x 2 again for continued diuresis as well as started on ASA 81 mg and Lipitor 40 mg daily to address his increased risk of CAD ([place] 10 year risk of 45-60% compared with 10% for age matched cohort).  His repeat CXR was largely unchanged from his initial CXR and showed  cardiomegaly and evidence of pulmonary edema. He received an ECHO while inpatient and will follow up on results as outpatient.    He was discharged with lisinopril 10 mg daily for his hypertension.   Follow-up issues:   Patient will follow up with at the [facility] with Dr. Casper Harrison on Monday 05/18/12 at 4 PM.  Please arrive at 3:45 PM. -Repeat BMET at follow-up to make sure potassium is normal now he is on Lasix -Evaluate fluid status. Does he still need Lasix? -Follow-up ECHO results.  -Follow-up HgbA1c (pending at discharge) -Follow-up respiratory status. May need to consider interstitial or other etiology if patient continues to be dyspneic if CHF appropriately has been treated. Epworth Sleepiness Scale of 10 - may indicate OSA and could benefit from further workup at some point.  Discharge Medications:  - ASA 81 mg daily  - Lipitor 40 mg daily  - Lisinopril 10 mg daily  - Lasix 20 mg daily  Disposition: Patient will be discharged home today with follow up at [facility] on Monday 05/18/2012.   Signed: Cathlyn Parsons MS-IV 05/15/2012, 11:37 AM  Priscella Mann PGY-3 05/15/2012

## 2012-05-15 NOTE — ED Provider Notes (Signed)
Medical screening examination/treatment/procedure(s) were performed by non-physician practitioner and as supervising physician I was immediately available for consultation/collaboration.   Karam Dunson, MD 05/15/12 1536 

## 2012-05-18 ENCOUNTER — Encounter (HOSPITAL_COMMUNITY): Payer: Self-pay | Admitting: *Deleted

## 2012-05-18 ENCOUNTER — Observation Stay (HOSPITAL_COMMUNITY)
Admission: EM | Admit: 2012-05-18 | Discharge: 2012-05-18 | Disposition: A | Discharge status: Home / Self Care | Payer: BC Managed Care – PPO | Attending: Emergency Medicine | Admitting: Emergency Medicine

## 2012-05-18 ENCOUNTER — Inpatient Hospital Stay: Payer: BC Managed Care – PPO | Admitting: Family Medicine

## 2012-05-18 DIAGNOSIS — T783XXA Angioneurotic edema, initial encounter: Principal | ICD-10-CM

## 2012-05-18 DIAGNOSIS — I10 Essential (primary) hypertension: Secondary | ICD-10-CM | POA: Insufficient documentation

## 2012-05-18 DIAGNOSIS — E119 Type 2 diabetes mellitus without complications: Secondary | ICD-10-CM

## 2012-05-18 DIAGNOSIS — E669 Obesity, unspecified: Secondary | ICD-10-CM | POA: Insufficient documentation

## 2012-05-18 DIAGNOSIS — T46905A Adverse effect of unspecified agents primarily affecting the cardiovascular system, initial encounter: Secondary | ICD-10-CM | POA: Insufficient documentation

## 2012-05-18 DIAGNOSIS — I509 Heart failure, unspecified: Secondary | ICD-10-CM | POA: Insufficient documentation

## 2012-05-18 DIAGNOSIS — Z6830 Body mass index (BMI) 30.0-30.9, adult: Secondary | ICD-10-CM | POA: Insufficient documentation

## 2012-05-18 LAB — CBC WITH DIFFERENTIAL/PLATELET
Blasts: 0 %
Eosinophils Relative: 4 % (ref 0–5)
MCH: 30.1 pg (ref 26.0–34.0)
MCHC: 35.2 g/dL (ref 30.0–36.0)
MCV: 85.4 fL (ref 78.0–100.0)
Metamyelocytes Relative: 0 %
Monocytes Absolute: 0.5 10*3/uL (ref 0.1–1.0)
Myelocytes: 0 %
Neutrophils Relative %: 39 % — ABNORMAL LOW (ref 43–77)
Platelets: 238 10*3/uL (ref 150–400)
Promyelocytes Absolute: 0 %
RBC: 4.72 MIL/uL (ref 4.22–5.81)
RDW: 13 % (ref 11.5–15.5)
nRBC: 0 /100 WBC

## 2012-05-18 LAB — BASIC METABOLIC PANEL
BUN: 21 mg/dL (ref 6–23)
Chloride: 99 mEq/L (ref 96–112)
GFR calc non Af Amer: 89 mL/min — ABNORMAL LOW (ref >90)
Glucose, Bld: 159 mg/dL — ABNORMAL HIGH (ref 70–99)
Potassium: 4.1 mEq/L (ref 3.5–5.1)
Sodium: 135 mEq/L (ref 135–145)

## 2012-05-18 LAB — GLUCOSE, CAPILLARY: Glucose-Capillary: 141 mg/dL — ABNORMAL HIGH (ref 70–99)

## 2012-05-18 MED ORDER — METHYLPREDNISOLONE SODIUM SUCC 125 MG IJ SOLR
125.0000 mg | Freq: Once | INTRAMUSCULAR | Status: AC
  Administered 2012-05-18: 125 mg via INTRAVENOUS
  Filled 2012-05-18: qty 2

## 2012-05-18 MED ORDER — INSULIN ASPART 100 UNIT/ML ~~LOC~~ SOLN: 0.0000 [IU] | Freq: Every day | SUBCUTANEOUS | Status: DC

## 2012-05-18 MED ORDER — FAMOTIDINE IN NACL 20-0.9 MG/50ML-% IV SOLN
20.0000 mg | Freq: Once | INTRAVENOUS | Status: AC
  Administered 2012-05-18: 20 mg via INTRAVENOUS
  Filled 2012-05-18: qty 50

## 2012-05-18 MED ORDER — ASPIRIN EC 81 MG PO TBEC
81.0000 mg | DELAYED_RELEASE_TABLET | Freq: Every day | ORAL | Status: DC
  Administered 2012-05-18: 81 mg via ORAL
  Filled 2012-05-18: qty 1

## 2012-05-18 MED ORDER — DIPHENHYDRAMINE HCL 25 MG PO CAPS: 50.0000 mg | ORAL_CAPSULE | Freq: Four times a day (QID) | ORAL | Status: DC | PRN

## 2012-05-18 MED ORDER — ACETAMINOPHEN 650 MG RE SUPP: 650.0000 mg | Freq: Four times a day (QID) | RECTAL | Status: DC | PRN

## 2012-05-18 MED ORDER — INSULIN ASPART 100 UNIT/ML ~~LOC~~ SOLN: 0.0000 [IU] | Freq: Three times a day (TID) | SUBCUTANEOUS | Status: DC

## 2012-05-18 MED ORDER — DIPHENHYDRAMINE HCL 50 MG/ML IJ SOLN
25.0000 mg | Freq: Once | INTRAMUSCULAR | Status: AC
  Administered 2012-05-18: 25 mg via INTRAVENOUS
  Filled 2012-05-18: qty 1

## 2012-05-18 MED ORDER — ACETAMINOPHEN 325 MG PO TABS: 650.0000 mg | ORAL_TABLET | Freq: Four times a day (QID) | ORAL | Status: DC | PRN

## 2012-05-18 NOTE — H&P (Signed)
Family Medicine Teaching [facility] Admission History and Physical  Patient name: Kyle Ballard Medical record number: [id number] Date of birth: [birth year] Age: 62 y.o. Gender: male  Primary Care Provider: Default, Provider, MD  Chief Complaint: Lip swelling History of Present Illness: Kyle Ballard is a 62 y.o. year old male with newly diagnosed DM type 2 and HTN presenting with angioedema. He was recently hospitalized for dyspnea, which was concerning for a CHF exacerbation. He was stable and appropriate for discharge on 05/15/12, and was discharged with the following new medications: aspirin, atorvastatin, furosemide and lisinopril. The patient notes that he was at home without any problems and took his medications are regularly scheduled on Sunday 05/17/12. The then awoke at 1:00 AM 1/13 to use the restroom and noted tingling in his lips. Then, they began to swell tremendously. This was accompanied by no other symptoms, specifically no swelling of the tongue and no shortness of breath. In the ED he was given solumedrol, benadryl, and famotidine. Currently, he notes that aside from his swollen lips, he feels great.   Patient Active Problem List  Diagnosis  . Obesity (BMI 30-39.9)  . Diabetes mellitus, type 2  . Angioedema  . Hypertension   Past Medical History: Past Medical History  Diagnosis Date  . Hypertension   . CHF (congestive heart failure)   . Diabetes mellitus without complication     Past Surgical History: Past Surgical History  Procedure Date  . Tumor removal   . Tonsillectomy     Social History: History   Social History  . Marital Status: Married    Spouse Name: N/A    Number of Children: N/A  . Years of Education: N/A   Social History Main Topics  . Smoking status: Former Smoker    Quit date: 05/10/2012  . Smokeless tobacco: Never Used  . Alcohol Use: No  . Drug Use: No  . Sexually Active:    Other Topics Concern  . None   Social History  Narrative  . None    Family History: No family history on file.  Allergies: Allergies  Allergen Reactions  . Lisinopril Swelling    No current facility-administered medications for this encounter.   Current Outpatient Prescriptions  Medication Sig Dispense Refill  . aspirin EC 81 MG EC tablet Take 1 tablet (81 mg total) by mouth daily.  30 tablet  0  . atorvastatin (LIPITOR) 40 MG tablet Take 1 tablet (40 mg total) by mouth daily at 6 PM.  30 tablet  0  . furosemide (LASIX) 20 MG tablet Take 1 tablet (20 mg total) by mouth 2 (two) times daily.  30 tablet  0  . lisinopril (PRINIVIL,ZESTRIL) 10 MG tablet Take 10 mg by mouth daily.       Review Of Systems: Per HPI with the following additions: none Otherwise 12 point review of systems was performed and was unremarkable.  Physical Exam: Temp:  [98.8 F (37.1 C)] 98.8 F (37.1 C) (01/13 0325) Resp:  [18] 18  (01/13 0325) BP: (121)/(75) 121/75 mmHg (01/13 0325) SpO2:  [96 %] 96 % (01/13 0325)  General: Pleasant man, appears stated age, NAD  HEENT: marked angioedema of lips, mucous membranes moist, tympanic membranes clear bilaterally, no OP swelling  Neck: no lypmhadenopathy, no JVD, negative abdominojugular reflex  Heart: regular rhythm with increased rate, no murmurs/rubs or gallops  Lungs: normal WOB, CTA-B Abdomen: obese with normal bowel sounds, nontender, nondistended, no masses, no organomegaly  Extremities: +1 pitting edema in  bilateral lower legs, no lesions, non tender  Skin: no lesions or rashes noted,  Neurology: mental status and speech normal, A&Ox3, no focal deficits on gross exam   Labs and Imaging:  Results for orders placed during the hospital encounter of 05/18/12 (from the past 24 hour(s))  CBC WITH DIFFERENTIAL     Status: Abnormal (Preliminary result)   Collection Time   05/18/12  3:43 AM      Component Value Range   WBC PENDING  4.0 - 10.5 K/uL   RBC PENDING  4.22 - 5.81 MIL/uL   Hemoglobin PENDING   13.0 - 17.0 g/dL   HCT PENDING  16.1 - 09.6 %   MCV PENDING  78.0 - 100.0 fL   MCH PENDING  26.0 - 34.0 pg   MCHC PENDING  30.0 - 36.0 g/dL   RDW PENDING  04.5 - 40.9 %   Platelets PENDING  150 - 400 K/uL   Neutro Abs PENDING  1.7 - 7.7 K/uL   Lymphs Abs PENDING  0.7 - 4.0 K/uL   Monocytes Absolute PENDING  0.1 - 1.0 K/uL   Eosinophils Absolute PENDING  0.0 - 0.7 K/uL   Basophils Absolute PENDING  0.0 - 0.1 K/uL   LUCs, % PENDING  0 - 4 %   LUC, Absolute PENDING  0.0 - 0.5 K/uL   Other PENDING     Other 2 PENDING     Neutrophils Relative 39 (*) 43 - 77 %   Lymphocytes Relative 42  12 - 46 %   Monocytes Relative 13 (*) 3 - 12 %   Eosinophils Relative 4  0 - 5 %   Basophils Relative 2 (*) 0 - 1 %   Band Neutrophils 0  0 - 10 %   Metamyelocytes Relative 0     Myelocytes 0     Promyelocytes Absolute 0     Blasts 0     nRBC 0  0 /100 WBC   WBC Morphology ATYPICAL LYMPHOCYTES    BASIC METABOLIC PANEL     Status: Abnormal   Collection Time   05/18/12  3:43 AM      Component Value Range   Sodium 135  135 - 145 mEq/L   Potassium 4.1  3.5 - 5.1 mEq/L   Chloride 99  96 - 112 mEq/L   CO2 27  19 - 32 mEq/L   Glucose, Bld 159 (*) 70 - 99 mg/dL   BUN 21  6 - 23 mg/dL   Creatinine, Ser 8.11  0.50 - 1.35 mg/dL   Calcium 9.3  8.4 - 91.4 mg/dL   GFR calc non Af Amer 89 (*) >90 mL/min   GFR calc Af Amer >90  >90 mL/min    No results found.    Assessment and Plan: Kyle Ballard is a 62 y.o. year old male presenting with angioedema likely due to lisinopril that was started last week. Otherwise he is stable.   # Angioedema - S/p benadryl, famotidine, and solumedrol in ED - Hold home meds, restart all but lisinopril when stable - PRN benadryl  #HTN - hold lisinopril as above - Consider starting CCB  #DM - Last A1C 8.0 - Carb modified diet - SSI  #CHF - Echo supposedly done 1/10 prior to d/c - Obtain results today  FENGI - SLIV, carb modified diet PPX - early  ambulation DISPO - Admit for observation for Family Med Teaching Service   Si Raider. Clinton Sawyer, MD, MBA 05/18/2012, 5:36 AM  Family Medicine Resident, PGY-2 [phone] pager

## 2012-05-18 NOTE — Progress Notes (Signed)
Pt admitted to room 4734, pt alert and oriented, VS wnl, pt placed on tele, and oriented to room.  Will carry out MD orders.

## 2012-05-18 NOTE — ED Notes (Signed)
Pt waiting for bed to be ready upstairs. Pt resting in chair in room. Ordered meal tray. Angioedema remains to top lip. Airway intact. Pt is 99% RA. Pt is alert and oriented.

## 2012-05-18 NOTE — ED Notes (Signed)
Family at bedside. 

## 2012-05-18 NOTE — ED Notes (Signed)
Top lips swollen and res, stated started taking lisinopril two day ago,had an bad reaction.

## 2012-05-18 NOTE — Discharge Summary (Signed)
Physician Discharge Summary  Patient ID: Kyle Ballard MRN: [id number] DOB/AGE: Jun 19, 1950 62 y.o.  Admit date: 05/18/2012 Discharge date: 05/18/2012  Admission Diagnoses: Angioedema 2/2 ACE Inhibitor  Discharge Diagnoses: [facility] Course: Kyle Ballard is a 62 year old man with PMH significant only for very recent diagnoses of DM2, HTN, HLD and possibel CHF discharged from [facility] on Friday 05/15/2012 with these new diagnoses. He was started on ASA 81 mg, Lasix 40 mg po, Atorvastatin 40 mg and Lisinopril 10 mg at that time.  This morning at 1 AM he started noticing some lip swelling which progressively worsened until 4 AM at which time he came to the ED.  In the ED, he appeared to be stable and was admitted for observation to the [facility] Medicine Teaching Service.  His respiratory status remained stable throughout the day and his angioedema improved significantly by early afternoon, at which time it was felt that he was stable for discharge.  His lisinopril was discontinued.    Discharge Exam: Blood pressure 115/84, pulse 63, temperature 98.2 F (36.8 C), temperature source Oral, resp. rate 20, height 5\' 10"  (1.778 m), weight 251 lb 15.8 oz (114.3 kg), SpO2 99.00%. Physical Exam  Constitutional: He is oriented to person, place, and time.  HENT:  Head: Normocephalic and atraumatic.  Mouth/Throat: Oropharynx is clear and moist.       Marked swelling of upper lip L>R and L cheek   Cardiovascular: Normal rate, regular rhythm and normal heart sounds.   Pulmonary/Chest: Effort normal and breath sounds normal.  Neurological: He is alert and oriented to person, place, and time.  Skin: Skin is warm, dry and intact.  Psychiatric: Mood and affect normal.   Disposition: Home     Medication List     As of 05/18/2012  2:22 PM    ASK your doctor about these medications         aspirin 81 MG EC tablet   Take 1 tablet (81 mg total) by mouth daily.      atorvastatin 40 MG  tablet   Commonly known as: LIPITOR   Take 1 tablet (40 mg total) by mouth daily at 6 PM.      furosemide 20 MG tablet   Commonly known as: LASIX   Take 1 tablet (20 mg total) by mouth 2 (two) times daily.      lisinopril 10 MG tablet   Commonly known as: PRINIVIL,ZESTRIL   Take 10 mg by mouth daily.       Follow Up Monday 1/20 at 2 PM with Dr. Clinton Ballard  FOLLOW-UP ISSUES:  - Echo pending read at time of discharge - please follow up with patient on results  - Hgb A1C 8.0 - discuss blood glucose control options with patient  - Recheck BMP in setting new Lasix prescription - consider new HTN agent as cannot take ACE/ARB  Signed: Cathlyn Parsons MS-IV 05/18/2012, 2:22 PM  Priscella Mann PGY-3

## 2012-05-18 NOTE — H&P (Signed)
FMTS Attending Admit Note Patient seen and examined by me in ED, discussed with Dr Clinton Sawyer and I agree with his assessment and plan.  Kyle Ballard presents today with angioedema involving only the lips, beginning Sunday, 1/12.  He was discharged from our service on 05/15/12 after initial presentation for dyspnea which was believed to be related to previously undiagnosed CHF and showing marked improvement with furosemide.  He had an ECHO done on the day of discharge, however he was discharged prior to an official reading being available because of his marked clinical improvement.  He was discharged on an ACEI, which is believed to be the offending agent for his angioedema.  The angioedema has not involved the tongue or airway.  He shows me pictures taken around 0100am this morning at its worst.  He reports that he had a similar occurrence around 15 years ago and presumed to be related to food, but it was never determined what provoked those episodes.  He has not had previous allergies to any medications.  Of note, he has not had any return of dyspnea or shortness of breath, nor chest pain.   On exam, he is comfortable and generally well-appearing.  Marked edema of lips, sparing the tongue.  Breathing comfortably.  Neck is supple. No adenopathy or erythema in anterior cervical chain or neck.  COR Regular S1S2 PULM Clear lung sounds, with good air movement.  No wheezing or rales.   Assess/Plan: Patient with mast-cell mediated angioedema that does not involve tongue or palate.  He has received solumedrol in the ED.  Would treat with both H1 and H2 histamine blockers, as well as oral prednisone (20mg /day) for the next 7 days.  He has made mild improvement when comparing his home photos on handheld device to his present condition; however, would monitor through the morning/early afternoon and consider discharge once clinical improvement is observed.  ACEI and ARB agents should both be listed in his Allergy  Profile.  I have informed the patient that he should consider himself allergic to both of these classes of medications.  I agree with use of amlodipine for blood pressure control.  Follow up on results of ECHO that was done prior to discharge on 1/10.  Paula Compton, MD

## 2012-05-18 NOTE — ED Notes (Signed)
Pt given a small ice pack for lips.

## 2012-05-18 NOTE — Discharge Summary (Signed)
I discussed with Dr Oh Park.  I agree with their plans documented in their progress note for today.  

## 2012-05-18 NOTE — Progress Notes (Signed)
Pt given DC instructions and verbalized understanding.  Pt DC home via wc.  

## 2012-05-18 NOTE — ED Notes (Signed)
Ordered patient carb modified breakfast tray.

## 2012-05-18 NOTE — ED Provider Notes (Signed)
History     CSN: [id number]  Arrival date & time 05/18/12  4098   First MD Initiated Contact with Patient 05/18/12 0340      Chief Complaint  Patient presents with  . Angioedema    (Consider location/radiation/quality/duration/timing/severity/associated sxs/prior treatment) HPIRonald Ballard is a 62 y.o. male he was recently put on lisinopril for a new onset congestive heart failure exacerbation last week - this was on Friday when he was discharged from the hospital. Patient says his lips started feeling funny yesterday and last night he went to bed and woke up with a swollen left upper lip. He says his breathing is much better since last week and has had no grade only tonight, no tongue swelling, or difficulty breathing, no abnormal breath sounds, no chest pain. Past Medical History  Diagnosis Date  . Hypertension   . CHF (congestive heart failure)   . Diabetes mellitus without complication     Past Surgical History  Procedure Date  . Tumor removal   . Tonsillectomy     No family history on file.  History  Substance Use Topics  . Smoking status: Former Smoker    Quit date: 05/10/2012  . Smokeless tobacco: Never Used  . Alcohol Use: No      Review of Systems At least 10pt or greater review of systems completed and are negative except where specified in the HPI.  Allergies  Lisinopril  Home Medications   Current Outpatient Rx  Name  Route  Sig  Dispense  Refill  . ASPIRIN 81 MG PO TBEC   Oral   Take 1 tablet (81 mg total) by mouth daily.   30 tablet   0   . ATORVASTATIN CALCIUM 40 MG PO TABS   Oral   Take 1 tablet (40 mg total) by mouth daily at 6 PM.   30 tablet   0   . FUROSEMIDE 20 MG PO TABS   Oral   Take 1 tablet (20 mg total) by mouth 2 (two) times daily.   30 tablet   0   . LISINOPRIL 10 MG PO TABS   Oral   Take 10 mg by mouth daily.           BP 125/76  Pulse 81  Temp 98 F (36.7 C) (Oral)  Resp 16  SpO2 96%  Physical  Exam  Nursing notes reviewed.  Electronic medical record reviewed. VITAL SIGNS:   Filed Vitals:   05/18/12 0325 05/18/12 0727  BP: 121/75 125/76  Pulse:  81  Temp: 98.8 F (37.1 C) 98 F (36.7 C)  TempSrc: Oral   Resp: 18 16  SpO2: 96% 96%   CONSTITUTIONAL: Awake, oriented, appears non-toxic HENT: Atraumatic, normocephalic, oral mucosa pink and moist. Grossly swollen upper lip worse on the left. Tongue is not involved, airway is patent - no involvement of soft palate the Nares patent without drainage. External ears normal. EYES: Conjunctiva clear, EOMI, PERRLA NECK: Trachea midline, non-tender, supple CARDIOVASCULAR: Normal heart rate, Normal rhythm, No murmurs, rubs, gallops PULMONARY/CHEST: Clear to auscultation, no rhonchi, wheezes, or rales. Symmetrical breath sounds. Non-tender. ABDOMINAL: Non-distended, soft, non-tender - no rebound or guarding.  BS normal. NEUROLOGIC: Non-focal, moving all four extremities, no gross sensory or motor deficits. EXTREMITIES: No clubbing, cyanosis, or edema SKIN: Warm, Dry, No erythema, No rash  ED Course  Procedures (including critical care time)  Labs Reviewed  CBC WITH DIFFERENTIAL - Abnormal; Notable for the following:    WBC 3.9 (*)  Neutrophils Relative 39 (*)     Monocytes Relative 13 (*)     Basophils Relative 2 (*)     Neutro Abs 1.5 (*)     All other components within normal limits  BASIC METABOLIC PANEL - Abnormal; Notable for the following:    Glucose, Bld 159 (*)     GFR calc non Af Amer 89 (*)     All other components within normal limits  GLUCOSE, CAPILLARY - Abnormal; Notable for the following:    Glucose-Capillary 141 (*)     All other components within normal limits   No results found.   1. Angioedema of lips       MDM  Kyle Ballard is a 62 y.o. male presents with angioedema of the lips. There is no airway involvement. Medications given including H2 blocker, Benadryl and Solu-Medrol. Do not need to  intervene on airway at this time-we'll admit the patient for observation to family medicine service. Discussed with family medicine resident for admission. Labs are otherwise unremarkable.        Jones Skene, MD 05/18/12 [phone]

## 2012-05-18 NOTE — ED Notes (Signed)
Lip swelling, onset 0130, takes lisinopril, started Friday, recently d/c'd from hospital for CHF, denies airway difficulty, denies throat involvement, denies sob, "just lip swelling".

## 2012-05-25 ENCOUNTER — Other Ambulatory Visit: Payer: Self-pay | Admitting: Family Medicine

## 2012-05-25 ENCOUNTER — Inpatient Hospital Stay: Payer: BC Managed Care – PPO | Admitting: Family Medicine

## 2012-05-28 ENCOUNTER — Other Ambulatory Visit (HOSPITAL_COMMUNITY): Payer: Self-pay | Admitting: Family Medicine

## 2012-05-29 ENCOUNTER — Telehealth: Payer: Self-pay

## 2012-05-29 DIAGNOSIS — I5020 Unspecified systolic (congestive) heart failure: Secondary | ICD-10-CM

## 2012-05-29 MED ORDER — FUROSEMIDE 20 MG PO TABS: 20.0000 mg | ORAL_TABLET | Freq: Two times a day (BID) | ORAL | Status: DC

## 2012-05-29 NOTE — Telephone Encounter (Signed)
Refill complete 

## 2012-05-29 NOTE — Telephone Encounter (Signed)
Pt is now out of Lasix - was only given a 15 day supply and his appt is on 2/5  [facility] [address]

## 2012-06-10 ENCOUNTER — Other Ambulatory Visit (HOSPITAL_COMMUNITY): Payer: Self-pay | Admitting: Family Medicine

## 2012-06-10 ENCOUNTER — Ambulatory Visit (INDEPENDENT_AMBULATORY_CARE_PROVIDER_SITE_OTHER): Payer: BC Managed Care – PPO | Admitting: Family Medicine

## 2012-06-10 ENCOUNTER — Encounter: Payer: Self-pay | Admitting: Family Medicine

## 2012-06-10 VITALS — BP 155/107 | HR 75 | Ht 70.0 in | Wt 255.0 lb

## 2012-06-10 DIAGNOSIS — I5041 Acute combined systolic (congestive) and diastolic (congestive) heart failure: Secondary | ICD-10-CM

## 2012-06-10 DIAGNOSIS — E119 Type 2 diabetes mellitus without complications: Secondary | ICD-10-CM

## 2012-06-10 DIAGNOSIS — I509 Heart failure, unspecified: Secondary | ICD-10-CM

## 2012-06-10 DIAGNOSIS — E1165 Type 2 diabetes mellitus with hyperglycemia: Secondary | ICD-10-CM

## 2012-06-10 DIAGNOSIS — I10 Essential (primary) hypertension: Secondary | ICD-10-CM

## 2012-06-10 DIAGNOSIS — E785 Hyperlipidemia, unspecified: Secondary | ICD-10-CM

## 2012-06-10 LAB — BASIC METABOLIC PANEL
BUN: 10 mg/dL (ref 6–23)
CO2: 28 mEq/L (ref 19–32)
Chloride: 106 mEq/L (ref 96–112)
Creat: 1 mg/dL (ref 0.50–1.35)
Glucose, Bld: 107 mg/dL — ABNORMAL HIGH (ref 70–99)
Potassium: 4 mEq/L (ref 3.5–5.3)

## 2012-06-10 MED ORDER — METFORMIN HCL 500 MG PO TABS: 500.0000 mg | ORAL_TABLET | Freq: Two times a day (BID) | ORAL | Status: DC

## 2012-06-10 NOTE — Patient Instructions (Addendum)
Kyle Ballard,   I am glad that you came in today. I understand that you don't want to take any unnecessary medications. I don't want to give you any. However, controlling your diabetes and heart failure are necessary for your long term health.   Please start taking Metformin 500 mg twice a day. Also, continue exercising and eating healthy. You are doing a great job.   I will send you a copy of your labs and follow up in 2 weeks to discuss blood pressure and heart failure.   Thank You,   Dr. Clinton Sawyer

## 2012-06-10 NOTE — Progress Notes (Signed)
  Subjective:    Patient ID: Kyle Ballard, male    DOB: 10-Jun-1950, 62 y.o.   MRN: [id number]  HPI  62 year old M w/ obesity with newly diagnosed with HTN, DM type 2, and CHF with reduced EF of 40% who was hospitalized in January with a CHF exacerbation following viral URI.    CHF with reduced EF - On Lasix 40 daily, was placed on lisinopril at hospital discharge but caused angioedema so stopped; Currently denies dyspnea and peripheral edema; increased exercise on a daily basis and denies dyspnea with taking 2 flights of stairs at work; notes he was previously very sedentary  Daibetes Mellitus Type 2 - Notes a past history of polyuria and CBG > 500 when checked on friend's meter; did not want to present to doctor so changed his diet and felt that his CBG improved, but notices that he lost stict control of diet; patient very resistant to use of medications to control DM and has never tried one; does not want to talk to diabetes educator    Review of Systems Negative unless stated above    Objective:   Physical Exam  BP 155/107  Pulse 75  Ht 5\' 10"  (1.778 m)  Wt 255 lb (115.667 kg)  BMI 36.59 kg/m2 Gen: middle aged AAM, obese, non distressed, pleasant and conversant CV: RRR, 2+ SEM throughout; no JVD Pulm: normal WOB; CTA-B Extremities: no edema bilat     Assessment & Plan:  Patient counseled for approximately 25 minutes on chronic disease management.

## 2012-06-11 ENCOUNTER — Encounter: Payer: Self-pay | Admitting: Family Medicine

## 2012-06-11 DIAGNOSIS — I5041 Acute combined systolic (congestive) and diastolic (congestive) heart failure: Secondary | ICD-10-CM | POA: Insufficient documentation

## 2012-06-11 NOTE — Assessment & Plan Note (Signed)
Counseled about treatment causes of CHF, natural progression of disease and modalities for treatment. Patient somewhat ignorant to severity of issue, but recognizes that high blood pressure and diabetes will make it worse - Cont Lasix for symptom management and check K+ today - Start beta blocker at next visit - Avoid ACE-I since angioedema

## 2012-06-11 NOTE — Assessment & Plan Note (Signed)
Patient likely with this issue for several years. He is very determined to manage this without medication, but is willing to use it now. We will use Metformin 500 mg BID for at least 3 months. He is planning daily exercise and improved diet. Obtain urine specimen at next visit.   Lab Results  Component Value Date   HGBA1C 8.0* 05/15/2012

## 2012-06-11 NOTE — Assessment & Plan Note (Signed)
Patient resistant to starting another new medication and was frustrated by process of detailing long term sequelae of chronic disease, therefore, we decided to wait to address this at next visit. Needs to be on beta blocker for CHF, so start at next visit.

## 2012-06-26 ENCOUNTER — Other Ambulatory Visit: Payer: Self-pay | Admitting: Family Medicine

## 2012-06-26 ENCOUNTER — Other Ambulatory Visit: Payer: Self-pay | Admitting: *Deleted

## 2012-06-26 MED ORDER — FUROSEMIDE 20 MG PO TABS: 20.0000 mg | ORAL_TABLET | Freq: Two times a day (BID) | ORAL | Status: DC

## 2012-06-26 NOTE — Telephone Encounter (Signed)
Patient states that pharmacy has sent over refill rx for Lasix twice. Looked in [employer] box and did not see any request. Patient is now completely out of Lasix.

## 2012-07-10 ENCOUNTER — Ambulatory Visit: Payer: BC Managed Care – PPO | Admitting: Family Medicine

## 2012-07-15 ENCOUNTER — Encounter: Payer: Self-pay | Admitting: Family Medicine

## 2012-07-15 ENCOUNTER — Ambulatory Visit (INDEPENDENT_AMBULATORY_CARE_PROVIDER_SITE_OTHER): Payer: BC Managed Care – PPO | Admitting: Family Medicine

## 2012-07-15 VITALS — BP 151/99 | HR 66 | Temp 98.1°F | Ht 70.0 in | Wt 249.2 lb

## 2012-07-15 LAB — FERRITIN: Ferritin: 245 ng/mL (ref 22–322)

## 2012-07-15 LAB — BASIC METABOLIC PANEL
CO2: 27 mEq/L (ref 19–32)
Chloride: 104 mEq/L (ref 96–112)
Glucose, Bld: 94 mg/dL (ref 70–99)
Potassium: 3.9 mEq/L (ref 3.5–5.3)
Sodium: 140 mEq/L (ref 135–145)

## 2012-07-15 MED ORDER — CARVEDILOL 12.5 MG PO TABS: 12.5000 mg | ORAL_TABLET | Freq: Two times a day (BID) | ORAL | Status: DC

## 2012-07-15 NOTE — Assessment & Plan Note (Addendum)
Stable at this point. Check Iron panel. Continue Lasix and check K+. Start Carvedilol 12.5 mg BID. Consider referral to cardiology for stress if chest tightness continues.

## 2012-07-15 NOTE — Progress Notes (Signed)
  Subjective:    Patient ID: Kyle Ballard, male    DOB: [birth year], 62 y.o.   MRN: [id number]  HPI  62 year old M with CHF NYHA stage 1, HTN, HLD, and Type 2 DM.   1. CHF Follow Up  Type: Chronic with reduced EF and diastolic dysfunction NHYA Class: 1 Last Echo: 05/15/12 - EF 40%, moderate LVH, diffuse hypokinesis, grade 1 diastolic dysfunction Current Meds:   Diuretic Yes - Lasix 20 mg BID  Beta Blocker  No  ACE-Inbitor No - Causes angioedema Interval History: Patient has been compliant with meds, denies any SOB or swelling, still climbing stairs at work for exercise, denies any CP but did notice intermittent tightness 2 weeks ago and resolved after 2 days   Previous Weight Wt Readings from Last 3 Encounters:  07/15/12 249 lb 4 oz (113.059 kg)  06/10/12 255 lb (115.667 kg)  05/18/12 251 lb 15.8 oz (114.3 kg)    Previous BP BP Readings from Last 3 Encounters:  07/15/12 151/99  06/10/12 155/107  05/18/12 115/84    2. Diabetes  Taking and tolerating: yes - Metformin 500 mg BID Fasting blood sugars: 90's  Hypoglycemic symptoms: no Diet Changes: yes - decreasing high fructose corn syrup  Exercise: 20 min 4x/ week Visual problems: no Last eye exam: never had Monitoring feet: not asked Numbness/Tingling: not asked Diabetic Labs:  Lab Results  Component Value Date   HGBA1C 8.0* 05/15/2012   Lab Results  Component Value Date   LDLCALC 102* 05/15/2012   CREATININE 1.00 06/10/2012   Last microalbumin: No results found for this basename: MICROALBUR, [id number]    Review of Systems See HPI     Objective:   Physical Exam BP 151/99  Pulse 66  Temp(Src) 98.1 F (36.7 C) (Oral)  Ht 5\' 10"  (1.778 m)  Wt 249 lb 4 oz (113.059 kg)  BMI 35.76 kg/m2 Gen: obese, AAM, NAD, pleasant and conversant Neck: 5 cm JVD CV: RRR, no murmurs, no hepatojugular reflux Pulm: Lungs CTA-B Extremities: 1+ pitting edema of ankles     Assessment & Plan:  62 year old with systolic and  diastolic CHF that needs improvement of meds and stable DM.

## 2012-07-15 NOTE — Patient Instructions (Signed)
Please return in 4 weeks. I will touch base with you about the Lasix and let you know your lab results. Also, please start the carvedilol twice daily.   Good work on the blood sugar!   Roslynn Amble, MD

## 2012-07-15 NOTE — Assessment & Plan Note (Signed)
Started on carvedilol 12.5 mg BID for HTN and CHF.

## 2012-07-15 NOTE — Assessment & Plan Note (Signed)
Cont metformin and diet/exercise. Check A1C at next visit. Check feet and optho exam.

## 2012-07-15 NOTE — Addendum Note (Signed)
Addended by: Garnetta Buddy on: 07/15/2012 06:54 PM   Modules accepted: Level of Service

## 2012-07-16 ENCOUNTER — Encounter: Payer: Self-pay | Admitting: Family Medicine

## 2012-08-19 ENCOUNTER — Ambulatory Visit (INDEPENDENT_AMBULATORY_CARE_PROVIDER_SITE_OTHER): Payer: BC Managed Care – PPO | Admitting: Family Medicine

## 2012-08-19 VITALS — BP 132/86 | HR 53 | Ht 70.0 in | Wt 252.0 lb

## 2012-08-19 DIAGNOSIS — E119 Type 2 diabetes mellitus without complications: Secondary | ICD-10-CM

## 2012-08-19 NOTE — Progress Notes (Signed)
  Subjective:    Patient ID: Kyle Ballard, male    DOB: [birth year], 62 y.o.   MRN: [id number]  HPI  Diabetes  Taking and tolerating: yes - Metformin 500 mg BID Fasting blood sugars: low 100's  Hypoglycemic symptoms: no Diet Changes: juicing a lot with kale, cucumber,  Exercise: purchased a bicycle and riding frequently  Visual problems: no Last eye exam: attempted to obtain one today in office, but camera not working Monitoring feet: yes Numbness/Tingling: no Diabetic Labs:  Lab Results  Component Value Date   HGBA1C 6.5 08/19/2012   HGBA1C 8.0* 05/15/2012   Lab Results  Component Value Date   LDLCALC 102* 05/15/2012   CREATININE 0.98 07/15/2012   Last microalbumin: No results found for this basename: MICROALBUR, [id number]   Taking an ACE-I ?: No - Angioedema   Review of Systems Negative    Objective:   Physical Exam BP 132/86  Pulse 53  Ht 5\' 10"  (1.778 m)  Wt 252 lb (114.306 kg)  BMI 36.16 kg/m2 Gen: middlle aged AAM, well appearing, NAD, pleasant and conversant CV: RRR, no m/r/g, no JVD or carotid bruits Pulm: normal WOB, CTA-B Abd: soft, NDNT, NABS Extremities: no edema or joint tenderness Feet: negative monofilament testing, no ulcers, 2+ DP pulses       Assessment & Plan:

## 2012-08-23 NOTE — Assessment & Plan Note (Signed)
Very well controlled on metformin 500 mg BID in combination with exercise and health eating. Continue current regimen. Needs follow up eye exam and urine micro-albumin at next visit.

## 2012-09-08 NOTE — Addendum Note (Signed)
Addended byArlyss Repress on: 09/08/2012 11:09 AM   Modules accepted: Orders

## 2012-10-07 ENCOUNTER — Encounter: Payer: Self-pay | Admitting: Family Medicine

## 2012-10-07 ENCOUNTER — Ambulatory Visit (INDEPENDENT_AMBULATORY_CARE_PROVIDER_SITE_OTHER): Payer: BC Managed Care – PPO | Admitting: Family Medicine

## 2012-10-07 VITALS — BP 146/89 | HR 45 | Temp 97.7°F | Ht 70.0 in | Wt 252.0 lb

## 2012-10-07 DIAGNOSIS — T783XXA Angioneurotic edema, initial encounter: Secondary | ICD-10-CM

## 2012-10-07 MED ORDER — EPINEPHRINE HCL 1 MG/ML IJ SOLN: 1.0000 mg | Freq: Once | INTRAMUSCULAR | Status: DC

## 2012-10-07 NOTE — Progress Notes (Signed)
  Subjective:    Patient ID: Kyle Ballard, male    DOB: Feb 16, 1951, 62 y.o.   MRN: [id number]  HPI  # SDA:  Last night between 7-8 pm, his lips started to swell The swelling was worse this morning and seems to be improving now  Inciting event? Unsure   -Denies new foods; ate at home   -Denies new soaps, medications, colognes   -He bikes outdoors (last a few days ago) and walks outdoors but denies exposure to poison ivy/oak or being in the woods  Review of Systems Denies fevers, chills, nausea, wheezing  Allergies, medication, past medical history reviewed.  Smoking status noted.  CHF T2 DM, on metformin HTN History of hospitalization 05/2012 for angioedema after starting ACEi (lisinopril)   Also history of lip swelling when working at [employer] several years ago     Objective:   Physical Exam GEN: NAD HEENT:   Head: [place]/AT   Eyes: normal conjunctiva without injection or tearing   Ears: TM clear bilaterally with good light reflex and without erythema or air-fluid level   Nose: no rhinorrhea, normal turbinates   LIPS: swollen bottom lip but seems to be improving based on photograph he showed me of himself this morning NECK: no LAD PULM: NI WOB    Assessment & Plan:

## 2012-10-07 NOTE — Patient Instructions (Addendum)
You may take Benadryl or an allergy medication like Claritin, Allegra, Zyrtec  Please get Epipen prescription filled   Please make a follow-up appointment with Dr. Madolyn Frieze on Friday

## 2012-10-07 NOTE — Assessment & Plan Note (Signed)
He has swollen lips again. Most recently in 05/2012 from lisinopril reaction. Cause of current lip swelling unclear. This has occurred one other time as well several years ago; etiology unclear at that time is well. His swelling is improving, and he has had no respiratory compromise or symptoms.  -May take benadryl or other antihistamine prn  -Considered allergy referral but patient declines at this time, says he will wait until it happens "one more time" -Rx Epipen to keep on hand

## 2012-10-09 ENCOUNTER — Ambulatory Visit (INDEPENDENT_AMBULATORY_CARE_PROVIDER_SITE_OTHER): Payer: BC Managed Care – PPO | Admitting: Family Medicine

## 2012-10-09 VITALS — BP 137/80 | HR 65 | Temp 98.2°F | Wt 248.0 lb

## 2012-10-09 DIAGNOSIS — I10 Essential (primary) hypertension: Secondary | ICD-10-CM

## 2012-10-09 DIAGNOSIS — I509 Heart failure, unspecified: Secondary | ICD-10-CM

## 2012-10-09 DIAGNOSIS — Z5189 Encounter for other specified aftercare: Secondary | ICD-10-CM

## 2012-10-09 DIAGNOSIS — I5022 Chronic systolic (congestive) heart failure: Secondary | ICD-10-CM

## 2012-10-09 DIAGNOSIS — T783XXD Angioneurotic edema, subsequent encounter: Secondary | ICD-10-CM

## 2012-10-09 MED ORDER — CARVEDILOL 3.125 MG PO TABS: 3.1250 mg | ORAL_TABLET | Freq: Two times a day (BID) | ORAL | Status: DC

## 2012-10-09 NOTE — Patient Instructions (Addendum)
Restart Coreg at the lowest dose 3.125 mg twice a day  Follow-up with Dr. Clinton Sawyer in 1-2 weeks to see how you are doing on this medication  Keep Claritin or other antihistamine on hand

## 2012-10-09 NOTE — Progress Notes (Signed)
  Subjective:    Patient ID: Kyle Ballard, male    DOB: 12-20-1950, 62 y.o.   MRN: [id number]  HPI # Follow-up lip swelling Initially seen 06/04  It is now resolved He took 1 dose of Claritin  # CHF, EF 40%, grade 1 diastolic He has been off Coreg, but he does not report any significant changes in fatigue, which he had been endorsing over the past few months  Review of Systems Denies dyspnea, chest pain, abdominal pain, leg swelling, dizziness  Allergies, medication, past medical history reviewed.  Smoking status noted.     Objective:   Physical Exam GEN: NAD LIPS: not swollen CV: RRR; no m/r/g PULM: NI WOB; CTAB without w/r/r    Assessment & Plan:

## 2012-10-09 NOTE — Assessment & Plan Note (Signed)
Resolved

## 2012-10-09 NOTE — Assessment & Plan Note (Addendum)
Not hypertensive, but due to history of systolic and diastolic CHF and significant angioedema with lisinopril which precludes ACEi and would use ARB with hesitation, will re-start Coreg but at lowest dose (he had been on 12.5 mg bid previously). BP okay off medication; he is only on Lasix. Follow-up in 1-2 weeks for re-evaluation or sooner if needed.

## 2012-11-12 ENCOUNTER — Other Ambulatory Visit: Payer: Self-pay

## 2012-11-18 ENCOUNTER — Ambulatory Visit (INDEPENDENT_AMBULATORY_CARE_PROVIDER_SITE_OTHER): Payer: BC Managed Care – PPO | Admitting: Family Medicine

## 2012-11-18 ENCOUNTER — Encounter: Payer: Self-pay | Admitting: Family Medicine

## 2012-11-18 VITALS — BP 151/89 | HR 65 | Ht 70.0 in | Wt 252.0 lb

## 2012-11-18 DIAGNOSIS — I5022 Chronic systolic (congestive) heart failure: Secondary | ICD-10-CM

## 2012-11-18 DIAGNOSIS — Z23 Encounter for immunization: Secondary | ICD-10-CM

## 2012-11-18 DIAGNOSIS — E119 Type 2 diabetes mellitus without complications: Secondary | ICD-10-CM

## 2012-11-18 DIAGNOSIS — E1165 Type 2 diabetes mellitus with hyperglycemia: Secondary | ICD-10-CM

## 2012-11-18 DIAGNOSIS — I509 Heart failure, unspecified: Secondary | ICD-10-CM

## 2012-11-18 DIAGNOSIS — E785 Hyperlipidemia, unspecified: Secondary | ICD-10-CM

## 2012-11-18 DIAGNOSIS — I10 Essential (primary) hypertension: Secondary | ICD-10-CM

## 2012-11-18 MED ORDER — ATORVASTATIN CALCIUM 40 MG PO TABS: ORAL_TABLET | ORAL | Status: DC

## 2012-11-18 MED ORDER — CARVEDILOL 3.125 MG PO TABS: 3.1250 mg | ORAL_TABLET | Freq: Two times a day (BID) | ORAL | Status: DC

## 2012-11-18 MED ORDER — METFORMIN HCL 500 MG PO TABS: 500.0000 mg | ORAL_TABLET | Freq: Two times a day (BID) | ORAL | Status: DC

## 2012-11-18 MED ORDER — FUROSEMIDE 20 MG PO TABS: 20.0000 mg | ORAL_TABLET | Freq: Two times a day (BID) | ORAL | Status: DC

## 2012-11-18 NOTE — Progress Notes (Signed)
Patient ID: Kyle Ballard, male   DOB: [birth year], 62 y.o.   MRN: [id number]  Subjective:   Diabetes, Type 2  Recent Issues:  Medication Compliance:  Diabetes medication: Yes - Metformin 500 mg BID  Taking ACE-I: No - angioedema  Taking statin: Yes  Behavioral:  Home CBG Monitoring: Yes - 88 this AM  Diet changes: Yes - less jiucing that usual  Exercise: Yes - bicycle and gym  Health Maintenance:  Visual problems: No  Last eye exam:08/19/12  Last dental visit: unknown, > 10 years  Foot ulcers: No  Hypertension  Home BP monitoring:  127/76 this AM   Office BP: BP Readings from Last 3 Encounters:  11/18/12 151/89  10/09/12 137/80  10/07/12 146/89    Prescribed meds: carvedilol 3.125 mg BID, furosemide 20 mg daily   Hypertension ROS:  Taking medications as prescribed:Yes Chest pain: No Shortness of breath: No Swelling of extremities: No TIA symptoms: No   Review of Systems:  negative except HPI     Objective:   Physical Exam: BP 151/89  Pulse 65  Ht 5\' 10"  (1.778 m)  Wt 252 lb (114.306 kg)  BMI 36.16 kg/m2  General: alert, well appearing, and in no distress Eyes: deferred Cardiovascular: RRR, nl S1 and S2, peripheral pulses normal Feet: warm, good capillary refill  Labs:   Diabetic Labs:  Lab Results  Component Value Date   HGBA1C 6.6 11/18/2012   HGBA1C 6.5 08/19/2012   HGBA1C 8.0* 05/15/2012   Lab Results  Component Value Date   LDLCALC 102* 05/15/2012   CREATININE 0.98 07/15/2012   Last microalbumin: No results found for this basename: MICROALBUR, [id number]   Wt Readings from Last 5 Encounters:  11/18/12 252 lb (114.306 kg)  10/09/12 248 lb (112.492 kg)  10/07/12 252 lb (114.306 kg)  08/19/12 252 lb (114.306 kg)  07/15/12 249 lb 4 oz (113.059 kg)       Assessment & Plan:

## 2012-11-18 NOTE — Patient Instructions (Addendum)
Kyle Ballard,   [name] are doing really well with your diabetes and blood pressure. Your A1C today is 6.6, which is at your goal of being less than 7. Please follow up in 3 months.   I think that you should have a colonoscopy is in your best interest. Read about it and let me know.   Sincerely,   Dr. Clinton Sawyer

## 2012-11-21 ENCOUNTER — Encounter: Payer: Self-pay | Admitting: Family Medicine

## 2012-11-21 NOTE — Assessment & Plan Note (Signed)
Very well controlled. Cont Metformin 500 mg BID. Check micro-albumin today since not on ACE-I.

## 2012-11-21 NOTE — Assessment & Plan Note (Signed)
Continue co-reg 3.125 BID since having previous symptoms of fatigue and weakness at higher doses. Continue Furosemide daily.

## 2013-03-11 ENCOUNTER — Other Ambulatory Visit: Payer: Self-pay

## 2013-08-12 ENCOUNTER — Other Ambulatory Visit: Payer: Self-pay

## 2013-08-26 ENCOUNTER — Telehealth: Payer: Self-pay | Admitting: *Deleted

## 2013-08-26 ENCOUNTER — Encounter: Payer: Self-pay | Admitting: Family Medicine

## 2013-08-26 NOTE — Telephone Encounter (Signed)
Called pt and informed, that we can not fill out FMLA form. Needs OV. Pt has OV on Monday with Dr.Williamson. Forms are with [place] at the nurses station. Mauricia Area

## 2013-08-26 NOTE — Progress Notes (Signed)
Pt's wife dropped off forms to be filled out regarding FMLA and states that the forms have to be in be May 1st.

## 2013-08-26 NOTE — Telephone Encounter (Signed)
Called pt's wife Kyle Ballard # [phone].

## 2013-08-30 ENCOUNTER — Encounter: Payer: Self-pay | Admitting: Family Medicine

## 2013-08-30 ENCOUNTER — Ambulatory Visit (INDEPENDENT_AMBULATORY_CARE_PROVIDER_SITE_OTHER): Payer: BC Managed Care – PPO | Admitting: Family Medicine

## 2013-08-30 VITALS — BP 153/102 | HR 57 | Temp 98.3°F | Wt 248.0 lb

## 2013-08-30 DIAGNOSIS — I10 Essential (primary) hypertension: Secondary | ICD-10-CM

## 2013-08-30 DIAGNOSIS — E119 Type 2 diabetes mellitus without complications: Secondary | ICD-10-CM

## 2013-08-30 DIAGNOSIS — E669 Obesity, unspecified: Secondary | ICD-10-CM

## 2013-08-30 DIAGNOSIS — R079 Chest pain, unspecified: Secondary | ICD-10-CM

## 2013-08-30 DIAGNOSIS — K0889 Other specified disorders of teeth and supporting structures: Secondary | ICD-10-CM

## 2013-08-30 DIAGNOSIS — H538 Other visual disturbances: Secondary | ICD-10-CM

## 2013-08-30 DIAGNOSIS — K089 Disorder of teeth and supporting structures, unspecified: Secondary | ICD-10-CM

## 2013-08-30 LAB — POCT GLYCOSYLATED HEMOGLOBIN (HGB A1C): Hemoglobin A1C: 6.7

## 2013-08-30 NOTE — Patient Instructions (Signed)
Dear Mr. Paiz,   Thank you for coming to clinic today. Please read below regarding the issues that we discussed.   1. Blood Pressure - This could be up from pain and taking the Goody's/ Come back in one month so we can recheck.   2. Chest Pain - If this happens again, then we definitely need you to get an exercise stress test. I think it is a good idea anyway in the near future.   3. Diabetes - You are doing great. Your A1C today was 6.7. Start the exercising, and it will get better.   4. Tooth Pain - If you need me to block the nerve for pain relief, please come back.   5. Vision Problems - I put in a referral to the ophthalmologist to check you out. If you have not heard anything in 2 weeks, then please let me know.   Please follow up in clinic in 4 weeks. Please call earlier if you have any questions or concerns.   Sincerely,   Dr. Maricela Bo

## 2013-08-30 NOTE — Progress Notes (Signed)
Patient ID: Kyle Ballard, male   DOB: [birth year], 63 y.o.   MRN: [id number]  Subjective:   63 year old M with DM type 2, HTN, CHF, and obesity.   Diabetes, Type 2  Recent Issues:  Medication Compliance:  Diabetes medication: Yes - Metformin 500 mg BID  Taking ACE-I: No - angioedema  Taking statin: Yes  Behavioral:  Home CBG Monitoring: Yes - 109 this AM  Diet changes: Yes - less jiucing that usual  Exercise: NO, pt stopped riding his bike in the winter  Health Maintenance:  Visual problems: yes - blurriness of left eye, intermittent without pain or drainage  Last eye exam:08/19/12  Last dental visit: unknown, > 10 years, pt with tooth pain of upper left  Foot ulcers: No  Hypertension  Home BP monitoring: 3 days ago 150/90s  Office BP: BP Readings from Last 3 Encounters:  08/30/13 153/102  11/18/12 151/89  10/09/12 137/80    Prescribed meds: carvedilol 3.125 mg BID, furosemide 20 mg daily   Hypertension ROS:  Taking medications as prescribed:Yes - might miss one tablet of coreg weekly  Chest pain: Yes - one episode 1 month ago that lasted 1 hour, went away spontaneously, thinks it was related to diet, Shortness of breath: No Swelling of extremities: No TIA symptoms: No Regular exercise: No Low Na+ diet: No Alcohol/tobacco/drug use: No   Tooth Pain - 1 year in duration, intermittent worsening, left upper incisor with fracture, no swelling or drainage, but just rotting, pt has not been to a dentist in years  Social - Pt with issues and neighbors stressing him out   Current Outpatient Prescriptions on File Prior to Visit  Medication Sig Dispense Refill  . aspirin EC 81 MG EC tablet Take 1 tablet (81 mg total) by mouth daily.  30 tablet  0  . atorvastatin (LIPITOR) 40 MG tablet TAKE ONE TABLET BY MOUTH DAILY AT 6 PM.  30 tablet  11  . carvedilol (COREG) 3.125 MG tablet Take 1 tablet (3.125 mg total) by mouth 2 (two) times daily with a meal.  60 tablet  11  .  furosemide (LASIX) 20 MG tablet Take 1 tablet (20 mg total) by mouth 2 (two) times daily.  30 tablet  11  . metFORMIN (GLUCOPHAGE) 500 MG tablet Take 1 tablet (500 mg total) by mouth 2 (two) times daily with a meal.  60 tablet  11  . EPINEPHrine (ADRENALIN) 1 MG/ML injection Inject 1 mL (1 mg total) into the muscle once.  1 mL  1   No current facility-administered medications on file prior to visit.     Review of Systems:  negative except HPI     Objective:   Physical Exam: BP 153/102  Pulse 57  Temp(Src) 98.3 F (36.8 C) (Oral)  Wt 248 lb (112.492 kg)  General: middle age AAM, obese, alert, well appearing, and in no distress Eyes: PERRLA, conjunctiva clear, fundoscopic exam difficult since non dilated, but view obstructed by particles in anterior eye Teeth: rotten 1st premolar with root of tooth still implanted in gum, but no swelling, redness or drainage Cardiovascular: RRR, nl S1 and S2, peripheral pulses normal Feet: warm, good capillary refill, normal monofilament testing  Labs:   Diabetic Labs:  Lab Results  Component Value Date   HGBA1C 6.7 08/30/2013   HGBA1C 6.6 11/18/2012   HGBA1C 6.5 08/19/2012   Lab Results  Component Value Date   LDLCALC 102* 05/15/2012   CREATININE 0.98 07/15/2012   Last  microalbumin: No results found for this basename: MICROALBUR,  [id number]   Wt Readings from Last 5 Encounters:  08/30/13 248 lb (112.492 kg)  11/18/12 252 lb (114.306 kg)  10/09/12 248 lb (112.492 kg)  10/07/12 252 lb (114.306 kg)  08/19/12 252 lb (114.306 kg)       Assessment & Plan:

## 2013-08-31 DIAGNOSIS — K0889 Other specified disorders of teeth and supporting structures: Secondary | ICD-10-CM | POA: Insufficient documentation

## 2013-08-31 DIAGNOSIS — H538 Other visual disturbances: Secondary | ICD-10-CM | POA: Insufficient documentation

## 2013-08-31 DIAGNOSIS — R079 Chest pain, unspecified: Secondary | ICD-10-CM | POA: Insufficient documentation

## 2013-08-31 NOTE — Assessment & Plan Note (Signed)
A: unclear etiology, but concerning for possible lens issue P: referred to ophthalmology for eval

## 2013-08-31 NOTE — Assessment & Plan Note (Signed)
A: well controlled P: continue metformin 500 mg BID

## 2013-08-31 NOTE — Assessment & Plan Note (Signed)
A: weight stable P: encouraged exercise

## 2013-08-31 NOTE — Assessment & Plan Note (Signed)
A: remote episode may be related to GI issues, but pt with risk factors for CAD P:  - Cont ASA, statin and beta blocker - Set up pt for exercise stress test

## 2013-08-31 NOTE — Assessment & Plan Note (Signed)
A: rotten pre-molar causing pain but no concern for infection P: pt offered superior alveolar block but declined; pt will f/u with dentist

## 2013-08-31 NOTE — Assessment & Plan Note (Signed)
A: poorly controlled, confounding factor of tooth pain P: cont co-reg and furosemide, recheck in 2 weeks

## 2013-09-01 NOTE — Progress Notes (Signed)
Left voice message informing pt and wife that FMLA paper work is completed and ready for pick up.  Derl Barrow, RN

## 2013-09-10 ENCOUNTER — Telehealth: Payer: Self-pay | Admitting: Family Medicine

## 2013-09-10 NOTE — Telephone Encounter (Signed)
LVM to pt   appt 05/18/@2 :30 Dr. Linward Foster [facility] [place] [phone] phone number   [place]

## 2013-11-17 ENCOUNTER — Ambulatory Visit (INDEPENDENT_AMBULATORY_CARE_PROVIDER_SITE_OTHER): Payer: BC Managed Care – PPO | Admitting: Family Medicine

## 2013-11-17 ENCOUNTER — Encounter: Payer: Self-pay | Admitting: Family Medicine

## 2013-11-17 VITALS — BP 152/109 | HR 73 | Temp 97.8°F | Ht 70.0 in | Wt 248.4 lb

## 2013-11-17 DIAGNOSIS — E1169 Type 2 diabetes mellitus with other specified complication: Secondary | ICD-10-CM

## 2013-11-17 DIAGNOSIS — I10 Essential (primary) hypertension: Secondary | ICD-10-CM

## 2013-11-17 DIAGNOSIS — I5041 Acute combined systolic (congestive) and diastolic (congestive) heart failure: Secondary | ICD-10-CM

## 2013-11-17 DIAGNOSIS — I509 Heart failure, unspecified: Secondary | ICD-10-CM

## 2013-11-17 DIAGNOSIS — E785 Hyperlipidemia, unspecified: Secondary | ICD-10-CM

## 2013-11-17 DIAGNOSIS — E119 Type 2 diabetes mellitus without complications: Secondary | ICD-10-CM

## 2013-11-17 DIAGNOSIS — I5032 Chronic diastolic (congestive) heart failure: Secondary | ICD-10-CM

## 2013-11-17 LAB — POCT URINALYSIS DIPSTICK
Bilirubin, UA: NEGATIVE
GLUCOSE UA: NEGATIVE
Ketones, UA: NEGATIVE
Nitrite, UA: NEGATIVE
PH UA: 6.5
Protein, UA: NEGATIVE
SPEC GRAV UA: 1.01
UROBILINOGEN UA: 0.2

## 2013-11-17 LAB — COMPREHENSIVE METABOLIC PANEL
ALT: 26 U/L (ref 0–53)
AST: 16 U/L (ref 0–37)
Albumin: 4.7 g/dL (ref 3.5–5.2)
Alkaline Phosphatase: 72 U/L (ref 39–117)
BUN: 9 mg/dL (ref 6–23)
CO2: 27 meq/L (ref 19–32)
Calcium: 9.8 mg/dL (ref 8.4–10.5)
Chloride: 102 mEq/L (ref 96–112)
Creat: 0.88 mg/dL (ref 0.50–1.35)
GLUCOSE: 103 mg/dL — AB (ref 70–99)
Potassium: 4.1 mEq/L (ref 3.5–5.3)
Sodium: 137 mEq/L (ref 135–145)
TOTAL PROTEIN: 7.6 g/dL (ref 6.0–8.3)
Total Bilirubin: 0.7 mg/dL (ref 0.2–1.2)

## 2013-11-17 LAB — LIPID PANEL
CHOL/HDL RATIO: 4.7 ratio
Cholesterol: 118 mg/dL (ref 0–200)
HDL: 25 mg/dL — AB (ref >39)
LDL Cholesterol: 56 mg/dL (ref 0–99)
TRIGLYCERIDES: 187 mg/dL — AB (ref <150)
VLDL: 37 mg/dL (ref 0–40)

## 2013-11-17 LAB — POCT GLYCOSYLATED HEMOGLOBIN (HGB A1C): Hemoglobin A1C: 7.3

## 2013-11-17 LAB — POCT UA - MICROSCOPIC ONLY

## 2013-11-17 NOTE — Assessment & Plan Note (Signed)
Asymptomatic and controlled.  - will get ECHO as last reading was 05/15/12.  - will adjust medications as needed.  - discussed with Dr. Mingo Amber.

## 2013-11-17 NOTE — Assessment & Plan Note (Addendum)
Uncontrolled. He reports home monitoring being in 130's/90's range. Will hold off on increasing coreg or adding another medication for three months. He will try to exercise and eat better until f/u.  - Continue current coreg dose  - will have nurse check for BP at least once a month prior to follow up - Either start Amlodipine or BiDil based on his ECHO  - CMP today  - Discussed with Dr. Mingo Amber

## 2013-11-17 NOTE — Patient Instructions (Signed)
Thank you for coming in,   We will watch your blood pressure and your sugars for the next three months.   Please try to increase your activity and watch what you eat.   Schedule a nurse visit in the clinic to have your blood pressure checked at least once a month.    I will call you with the lab results from today.   Follow up with me in three months.    Please feel free to call with any questions or concerns at any time, at [phone]. --Dr. Raeford Razor  Diet Recommendations for Diabetes   Starchy (carb) foods include: Bread, rice, pasta, potatoes, corn, crackers, bagels, muffins, all baked goods.   Protein foods include: Meat, fish, poultry, eggs, dairy foods, and beans such as pinto and kidney beans (beans also provide carbohydrate).   1. Eat at least 3 meals and 1-2 snacks per day. Never go more than 4-5 hours while awake without eating.  2. Limit starchy foods to TWO per meal and ONE per snack. ONE portion of a starchy  food is equal to the following:   - ONE slice of bread (or its equivalent, such as half of a hamburger bun).   - 1/2 cup of a "scoopable" starchy food such as potatoes or rice.   - 1 OUNCE (28 grams) of starchy snack foods such as crackers or pretzels (look on label).   - 15 grams of carbohydrate as shown on food label.  3. Both lunch and dinner should include a protein food, a carb food, and vegetables.   - Obtain twice as many veg's as protein or carbohydrate foods for both lunch and dinner.   - Try to keep frozen veg's on hand for a quick vegetable serving.     - Fresh or frozen veg's are best.  4. Breakfast should always include protein.

## 2013-11-17 NOTE — Assessment & Plan Note (Signed)
Currently on Lipitor with no intolerance.  - lipid panel today  - will adjust med as lab results.  - still smokes occasionally, will continue need to stop.  - on daily 81 mg ASA.

## 2013-11-17 NOTE — Assessment & Plan Note (Signed)
Somewhat controlled. Hgb A1c increasing. He has deviated from his diet and exercise. Will have a trial of diet and exercise for the next three months before titrating up current medications.  - continue current dose of metformin  - may need to increase to Metformin 1000 mg BID at follow up in three months.

## 2013-11-17 NOTE — Progress Notes (Signed)
   Subjective:    Patient ID: Kyle Ballard, male    DOB: 1950/06/10, 63 y.o.   MRN: [id number]  HPI  Kyle Ballard is here to meet new PCP.  CHRONIC DIABETES  Disease Monitoring  Blood Sugar Ranges: 180-200  Polyuria: no   Visual problems: no   Medication Compliance: yes  Medication Side Effects  Hypoglycemia: no   Preventitive Health Care  Eye Exam: 08/19/13  Foot Exam: 08/19/13  Diet pattern: has gotten into some old habits. Not eating as well   Exercise: has stopped exercising.   HTN Disease Monitoring:130/90 at work  Home BP Monitoring yes  Chest pain- no    Dyspnea- no Medications: Coreg,  Compliance-  Misses every once in a while . Lightheadedness-  no  Edema- no  HLD: currently is taking a statin with no side effects. Is compliant with medication. He has deviated from his usual diet as of late. He is not exercising as much as he usually does.   CHF: reports no SOB or chest pain. He is compliant with his medications. He has no edema.    Current Outpatient Prescriptions on File Prior to Visit  Medication Sig Dispense Refill  . aspirin EC 81 MG EC tablet Take 1 tablet (81 mg total) by mouth daily.  30 tablet  0  . atorvastatin (LIPITOR) 40 MG tablet TAKE ONE TABLET BY MOUTH DAILY AT 6 PM.  30 tablet  11  . carvedilol (COREG) 3.125 MG tablet Take 1 tablet (3.125 mg total) by mouth 2 (two) times daily with a meal.  60 tablet  11  . furosemide (LASIX) 20 MG tablet Take 1 tablet (20 mg total) by mouth 2 (two) times daily.  30 tablet  11  . metFORMIN (GLUCOPHAGE) 500 MG tablet Take 1 tablet (500 mg total) by mouth 2 (two) times daily with a meal.  60 tablet  11  . EPINEPHrine (ADRENALIN) 1 MG/ML injection Inject 1 mL (1 mg total) into the muscle once.  1 mL  1   No current facility-administered medications on file prior to visit.   Review of Systems See HPI     Objective:   Physical Exam BP 152/109  Pulse 73  Temp(Src) 97.8 F (36.6 C) (Oral)  Ht 5\' 10"  (1.778 m)   Wt 248 lb 6.4 oz (112.674 kg)  BMI 35.64 kg/m2 Gen: NAD, alert, cooperative with exam, well-appearing CV: RRR, good S1/S2, no murmur, no edema, capillary refill brisk  Resp: CTABL, no wheezes, non-labored Abd: protuberant abdomen  Skin: no rashes, normal turgor  Neuro: no gross deficits.  Psych: good insight, alert and oriented  *Recheck BP 148/100 manual right arm        Assessment & Plan:

## 2013-11-18 ENCOUNTER — Other Ambulatory Visit: Payer: Self-pay | Admitting: Family Medicine

## 2013-11-18 ENCOUNTER — Telehealth: Payer: Self-pay | Admitting: *Deleted

## 2013-11-18 DIAGNOSIS — I5032 Chronic diastolic (congestive) heart failure: Secondary | ICD-10-CM

## 2013-11-18 NOTE — Telephone Encounter (Signed)
Called over to echo lab and left a message for the echo to be changed to without contrast per Dr. Raeford Razor

## 2013-11-19 NOTE — Telephone Encounter (Signed)
Spoke with and has been changed to without contrast

## 2013-11-20 ENCOUNTER — Other Ambulatory Visit: Payer: Self-pay | Admitting: Family Medicine

## 2013-11-21 ENCOUNTER — Other Ambulatory Visit: Payer: Self-pay | Admitting: Family Medicine

## 2013-11-23 ENCOUNTER — Other Ambulatory Visit: Payer: Self-pay | Admitting: *Deleted

## 2013-11-23 DIAGNOSIS — IMO0002 Reserved for concepts with insufficient information to code with codable children: Secondary | ICD-10-CM

## 2013-11-23 DIAGNOSIS — E785 Hyperlipidemia, unspecified: Secondary | ICD-10-CM

## 2013-11-23 DIAGNOSIS — E1165 Type 2 diabetes mellitus with hyperglycemia: Secondary | ICD-10-CM

## 2013-11-24 ENCOUNTER — Other Ambulatory Visit: Payer: Self-pay | Admitting: *Deleted

## 2013-11-24 DIAGNOSIS — E785 Hyperlipidemia, unspecified: Secondary | ICD-10-CM

## 2013-11-24 DIAGNOSIS — IMO0002 Reserved for concepts with insufficient information to code with codable children: Secondary | ICD-10-CM

## 2013-11-24 DIAGNOSIS — E1165 Type 2 diabetes mellitus with hyperglycemia: Secondary | ICD-10-CM

## 2013-11-24 MED ORDER — ATORVASTATIN CALCIUM 40 MG PO TABS: ORAL_TABLET | ORAL | Status: DC

## 2013-11-24 MED ORDER — METFORMIN HCL 500 MG PO TABS
500.0000 mg | ORAL_TABLET | Freq: Two times a day (BID) | ORAL | Status: DC
Start: 2013-11-24 — End: 2014-12-15

## 2013-11-26 ENCOUNTER — Other Ambulatory Visit: Payer: Self-pay | Admitting: *Deleted

## 2013-11-26 DIAGNOSIS — I5022 Chronic systolic (congestive) heart failure: Secondary | ICD-10-CM

## 2013-11-29 ENCOUNTER — Other Ambulatory Visit: Payer: Self-pay | Admitting: *Deleted

## 2013-11-29 DIAGNOSIS — I5022 Chronic systolic (congestive) heart failure: Secondary | ICD-10-CM

## 2013-11-30 ENCOUNTER — Other Ambulatory Visit (HOSPITAL_COMMUNITY): Payer: BC Managed Care – PPO

## 2013-11-30 MED ORDER — FUROSEMIDE 20 MG PO TABS: 20.0000 mg | ORAL_TABLET | Freq: Two times a day (BID) | ORAL | Status: DC

## 2013-12-20 ENCOUNTER — Other Ambulatory Visit: Payer: Self-pay | Admitting: *Deleted

## 2013-12-20 ENCOUNTER — Ambulatory Visit (INDEPENDENT_AMBULATORY_CARE_PROVIDER_SITE_OTHER): Payer: BC Managed Care – PPO | Admitting: *Deleted

## 2013-12-20 VITALS — BP 138/70

## 2013-12-20 DIAGNOSIS — I5022 Chronic systolic (congestive) heart failure: Secondary | ICD-10-CM

## 2013-12-20 DIAGNOSIS — I10 Essential (primary) hypertension: Secondary | ICD-10-CM

## 2013-12-20 NOTE — Progress Notes (Signed)
  Pt in nurse clinic for blood pressure check.  BP 138/70 manually.  Pt stated he is taking his medications as prescribed and feeling pretty good.  Pt denies any symptoms today.  Derl Barrow, RN

## 2013-12-23 ENCOUNTER — Other Ambulatory Visit: Payer: Self-pay | Admitting: Family Medicine

## 2013-12-23 DIAGNOSIS — I5022 Chronic systolic (congestive) heart failure: Secondary | ICD-10-CM

## 2013-12-23 MED ORDER — CARVEDILOL 3.125 MG PO TABS: 3.1250 mg | ORAL_TABLET | Freq: Two times a day (BID) | ORAL | Status: DC

## 2013-12-27 ENCOUNTER — Other Ambulatory Visit (HOSPITAL_COMMUNITY): Payer: BC Managed Care – PPO

## 2014-01-24 ENCOUNTER — Ambulatory Visit (HOSPITAL_COMMUNITY): Payer: BC Managed Care – PPO | Attending: Cardiology | Admitting: Cardiology

## 2014-01-24 DIAGNOSIS — I10 Essential (primary) hypertension: Secondary | ICD-10-CM | POA: Insufficient documentation

## 2014-01-24 DIAGNOSIS — I509 Heart failure, unspecified: Secondary | ICD-10-CM

## 2014-01-24 DIAGNOSIS — E669 Obesity, unspecified: Secondary | ICD-10-CM | POA: Insufficient documentation

## 2014-01-24 DIAGNOSIS — E119 Type 2 diabetes mellitus without complications: Secondary | ICD-10-CM | POA: Diagnosis not present

## 2014-01-24 DIAGNOSIS — Z6835 Body mass index (BMI) 35.0-35.9, adult: Secondary | ICD-10-CM | POA: Diagnosis not present

## 2014-01-24 DIAGNOSIS — I5042 Chronic combined systolic (congestive) and diastolic (congestive) heart failure: Secondary | ICD-10-CM | POA: Insufficient documentation

## 2014-01-24 DIAGNOSIS — I517 Cardiomegaly: Secondary | ICD-10-CM | POA: Diagnosis not present

## 2014-01-24 DIAGNOSIS — I5032 Chronic diastolic (congestive) heart failure: Secondary | ICD-10-CM

## 2014-01-24 NOTE — Progress Notes (Signed)
Echo performed. 

## 2014-01-25 ENCOUNTER — Telehealth: Payer: Self-pay | Admitting: *Deleted

## 2014-01-25 NOTE — Telephone Encounter (Signed)
Message copied by Johny Shears on Tue Jan 25, 2014  3:02 PM ------      Message from: Rosemarie Ax      Created: Tue Jan 25, 2014  2:29 PM       Please call patient and informed that his ECHO was normal. Thank you. ------

## 2014-01-25 NOTE — Telephone Encounter (Signed)
LVM for patient to call back. ?

## 2014-01-26 NOTE — Telephone Encounter (Signed)
Pt informed. Kyle Ballard Dawn  

## 2014-01-26 NOTE — Telephone Encounter (Signed)
Message copied by Johny Shears on Wed Jan 26, 2014  8:46 AM ------      Message from: Rosemarie Ax      Created: Tue Jan 25, 2014  2:29 PM       Please call patient and informed that his ECHO was normal. Thank you. ------

## 2014-01-26 NOTE — Telephone Encounter (Signed)
LVM for patient to call back. ?

## 2014-02-07 ENCOUNTER — Encounter: Payer: Self-pay | Admitting: Family Medicine

## 2014-02-07 ENCOUNTER — Ambulatory Visit (INDEPENDENT_AMBULATORY_CARE_PROVIDER_SITE_OTHER): Payer: BC Managed Care – PPO | Admitting: Family Medicine

## 2014-02-07 VITALS — BP 153/94 | HR 93 | Temp 97.9°F | Wt 252.4 lb

## 2014-02-07 DIAGNOSIS — R079 Chest pain, unspecified: Secondary | ICD-10-CM | POA: Insufficient documentation

## 2014-02-07 DIAGNOSIS — R0789 Other chest pain: Secondary | ICD-10-CM

## 2014-02-07 MED ORDER — NITROGLYCERIN 0.4 MG SL SUBL
0.4000 mg | SUBLINGUAL_TABLET | SUBLINGUAL | Status: DC | PRN
Start: 2014-02-07 — End: 2014-11-11

## 2014-02-07 NOTE — Patient Instructions (Signed)
Myocardial Infarction A myocardial infarction (MI) is damage to the heart that is not reversible. It is also called a heart attack. An MI usually occurs when a heart (coronary) artery becomes blocked or narrowed. This cuts off the blood supply to the heart. When one or more of the heart (coronary) arteries becomes blocked, that area of the heart begins to die. This causes pain felt during an MI.  If you think you might be having an MI, call your local emergency services immediately (911 in [place] It is recommended that you chew and swallow 3 non-enteric coated baby aspirin if you do not have an aspirin allergy. Do not drive yourself to the hospital or wait to see if your symptoms go away. The sooner MI is treated, the greater the amount of heart muscle saved. Time is muscle. It can save your life. CAUSES  An MI can occur from:  A gradual buildup of a fatty substance called plaque. When plaque builds up in the arteries, this condition is called atherosclerosis. This buildup can block or reduce the blood supply to the heart artery(s).  A sudden plaque rupture within a heart artery that causes a blood clot (thrombus). A blood clot can block the heart artery which does not allow blood flow to the heart.  A severe tightening (spasm) of the heart artery. This is a less common cause of a heart attack. When a heart artery spasms, it cuts off blood flow through the artery. Spasms can occur in heart arteries that do not have atherosclerosis. RISK FACTORS People at risk for an MI usually have one or more risk factors, such as:  High blood pressure.  High cholesterol.  Smoking.  Gender. Men have a higher heart attack risk.  Overweight/obesity.  Age.  Family history.  Lack of exercise.  Diabetes.  Stress.  Excessive alcohol use.  Street drug use (cocaine and methamphetamines). SYMPTOMS  MI symptoms can vary, such as:  In both men and women, MI symptoms can include the following:  Chest  pain. The chest pain may feel like a crushing, squeezing, or "pressure" type feeling. MI pain can be "referred," meaning pain can be caused in one part of the body but felt in another part of the body. Referred MI pain may occur in the left arm, neck, or jaw. Pain may even be felt in the right arm.  Shortness of breath (dyspnea).  Heartburn or indigestion with or without vomiting, shortness of breath, or sweating (diaphoresis).  Sudden, cold sweats.  Sudden lightheadedness.  Upper back pain.  Women can have unique MI symptoms, such as:  Unexplained feelings of nervousness or anxiety.  Discomfort between the shoulder blades (scapula) or upper back.  Tingling in the hands and arms.  In elderly people (regardless of gender), MI symptoms can be subtle, such as:  Sweating (diaphoresis).  Shortness of breath (dyspnea).  General tiredness (fatigue) or not feeling well (malaise). DIAGNOSIS  Diagnosis of an MI involves several tests such as:  An assessment of your vital signs such as heart rhythm, blood pressure, respiratory rate, and oxygen level.  An EKG (ECG) to look at the electrical activity of your heart.  Blood tests called cardiac markers are drawn at scheduled times to measure proteins or enzymes released by the damaged heart muscle.  A chest X-ray.  An echocardiogram to evaluate heart motion and blood flow.  Coronary angiography (cardiac catheterization). This is a diagnostic procedure to look at the heart arteries. TREATMENT  Acute Intervention. For  an MI, the national standard in the [place] is to have an acute intervention in under 90 minutes from the time you get to the hospital. An acute intervention is a special procedure to open up the heart arteries. It is done in a treatment room called a "catheterization lab" (cath lab). Some hospitals do no have a cath lab. If you are having an MI and the hospital does not have a cath lab, the standard is to transport you  to a hospital that has one. In the cath lab, acute intervention includes:  Angioplasty. An angioplasty involves inserting a thin, flexible tube (catheter) into an artery in either your groin or wrist. The catheter is threaded to the heart arteries. A balloon at the end of the catheter is inflated to open a narrowed or blocked heart artery. During an angioplasty procedure, a small mesh tube (stent) may be used to keep the heart artery open. Depending on your condition and health history, one of two types of stents may be placed:  Drug-eluting stent (DES). A DES is coated with a medicine to prevent scar tissue from growing over the stent. With drug-eluting stents, blood thinning medicine will need to be taken for up to a year.  Bare metal stent. This type of stent has no special coating to keep tissue from growing over it. This type of stent is used if you cannot take blood thinning medicine for a prolonged time or you need surgery in the near future. After a bare metal stent is placed, blood thinning medicine will need to be taken for about a month.  If you are taking blood thinning medicine (anti-platelet therapy) after stent placement, do not stop taking it unless your caregiver says it is okay to do so. Make sure you understand how long you need to take the medicine. Surgical Intervention  If an acute intervention is not successful, surgery may be needed:  Open heart surgery (coronary artery bypass graft, CABG). CABG takes a vein (saphenous vein) from your leg. The vein is then attached to the blocked heart artery which bypasses the blockage. This then allows blood flow to the heart muscle. Additional Interventions  A "clot buster" medicine (thrombolytic) may be given. This medicine can help break up a clot in the heart artery. This medicine may be given if a person cannot get to a cath lab right away.  Intra-aortic balloon pump (IABP). If you have suffered a very severe MI and are too unstable  to go to the cath lab or to surgery, an IABP may be used. This is a temporary mechanical device used to increase blood flow to the heart and reduce the workload of the heart until you are stable enough to go to the cath lab or surgery. HOME CARE INSTRUCTIONS After an MI, you may need the following:  Medicine. Take medicine as directed by your caregiver. Medicines after an MI may:  Keep your blood from clotting easily (blood thinners).  Control your blood pressure.  Help lower your cholesterol.  Control abnormal heart rhythms.  Lifestyle changes. Under the guidance of your caregiver, lifestyle changes include:  Quitting smoking, if you smoke. Your caregiver can help you quit.  Being physically active.  Maintaining a healthy weight.  Eating a heart healthy diet. A dietitian can help you learn healthy eating options.  Managing diabetes.  Reducing stress.  Limiting alcohol intake. SEEK IMMEDIATE MEDICAL CARE IF:   You have severe chest pain, especially if the pain is crushing  or pressure-like and spreads to the arms, back, neck, or jaw. This is an emergency. Do not wait to see if the pain will go away. Get medical help at once. Call your local emergency services (911 in the [place] Do not drive yourself to the hospital.  You have shortness of breath during rest, sleep, or with activity.  You have sudden sweating or clammy skin.  You feel sick to your stomach (nauseous) and throw up (vomit).  You suddenly become lightheaded or dizzy.  You feel your heart beating rapidly or you notice "skipped" beats. MAKE SURE YOU:   Understand these instructions.  Will watch your condition.  Will get help right away if you are not doing well or get worse. Document Released: 04/22/2005 Document Revised: 04/27/2013 Document Reviewed: 06/25/2013 [facility] Patient Information 2015 [place], [place]. This information is not intended to replace advice given to you by your health care provider.  Make sure you discuss any questions you have with your health care provider.  Please make sure to keep your cardiology appointment. I have called in some nitroglycerin times for you to use as directed in the event you have chest pain.

## 2014-02-07 NOTE — Progress Notes (Signed)
   Subjective:    Patient ID: Kyle Ballard, male    DOB: [birth year], 63 y.o.   MRN: [id number]  HPI  Kyle Ballard is a 63 y.o. male presents to family medicine clinic for chest pain follow-up  Chest pain: Patient was seen approximately 3 weeks ago concerning chest pain/ dyspnea on exertion. He has a history of CHF, HTN, hyperlipidemia, obesity and diabetes. His last A1c was 7.3. He is a former smoker. He has no past history of MI. He reports he was ordered a echo for his chest pain, which he has completed and was told it was "normal". Echo 01/24/2014: LV 50-55%. LVH. Grade 1 diastolic. Trivial aortic regurg. Mild mitral regurg, mildly dilated left atrium.  Aorta mildly dilated. EKG not in the system to review.  He reports he had a mild respiratory virus a few weeks back and thought his shortness of breath was coming from being ill. However, he has noticed continued dizziness, dyspnea with occasional chest pain on exertion. He states his chest pain is central and feels more "tight" than pain. He reports it usually does not radiate, however last week, while he was sitting he did get the tight feeling in his chest and a "numb" feeling in his left arm. He denies nausea or diaphoresis at that time. He has tried nothing to make it better other than rest, which usually works. He states his job requires him to walk up and down flights of stairs and he feels the chest pain/dyspnea has progressively been getting worse climbing stairs over the last few weeks. He blood pressures, per chart have been borderline in the office, but he states he takes he BP at home and gets 120-130/ 65-85 usually. He attempts to watch the salt content in his diet. He denies current chest pain, dyspnea, cough, leg edema, orthopnea, dizziness or palpitations. Patient is compliant with medications: coreg, lasix, lipitor, ASA.  Review of Systems Per history of present illness    Objective:   Physical Exam BP 153/94  Pulse 93  Temp(Src)  97.9 F (36.6 C) (Oral)  Wt 252 lb 6.4 oz (114.488 kg) Gen: Pleasant, AAM, obese, well developed well nourished, no acute distress. .  CV: RRR, no murmur appreciated, distant heart sounds.  Chest: CTAB, no wheeze or crackles  Ext: No erythema. No edema. +2/4 DP/PT     Assessment & Plan:

## 2014-02-08 ENCOUNTER — Ambulatory Visit (INDEPENDENT_AMBULATORY_CARE_PROVIDER_SITE_OTHER): Payer: BC Managed Care – PPO | Admitting: Cardiovascular Disease

## 2014-02-08 ENCOUNTER — Encounter: Payer: Self-pay | Admitting: Cardiovascular Disease

## 2014-02-08 ENCOUNTER — Encounter (HOSPITAL_COMMUNITY): Payer: Self-pay | Admitting: Pharmacy Technician

## 2014-02-08 VITALS — BP 168/104 | HR 63 | Ht 70.0 in | Wt 251.1 lb

## 2014-02-08 DIAGNOSIS — I10 Essential (primary) hypertension: Secondary | ICD-10-CM

## 2014-02-08 DIAGNOSIS — E785 Hyperlipidemia, unspecified: Secondary | ICD-10-CM

## 2014-02-08 DIAGNOSIS — E1169 Type 2 diabetes mellitus with other specified complication: Secondary | ICD-10-CM

## 2014-02-08 DIAGNOSIS — R079 Chest pain, unspecified: Secondary | ICD-10-CM

## 2014-02-08 LAB — PROTIME-INR
INR: 1.1 ratio — AB (ref 0.8–1.0)
Prothrombin Time: 12.3 s (ref 9.6–13.1)

## 2014-02-08 LAB — BASIC METABOLIC PANEL
BUN: 10 mg/dL (ref 6–23)
CO2: 30 mEq/L (ref 19–32)
Calcium: 9.6 mg/dL (ref 8.4–10.5)
Chloride: 102 mEq/L (ref 96–112)
Creatinine, Ser: 0.9 mg/dL (ref 0.4–1.5)
GFR: 105.66 mL/min (ref >60.00)
Glucose, Bld: 99 mg/dL (ref 70–99)
Potassium: 4.1 mEq/L (ref 3.5–5.1)
Sodium: 137 mEq/L (ref 135–145)

## 2014-02-08 LAB — CBC
HCT: 42.2 % (ref 39.0–52.0)
Hemoglobin: 13.9 g/dL (ref 13.0–17.0)
MCHC: 32.9 g/dL (ref 30.0–36.0)
MCV: 89.2 fl (ref 78.0–100.0)
PLATELETS: 327 10*3/uL (ref 150.0–400.0)
RBC: 4.73 Mil/uL (ref 4.22–5.81)
RDW: 13.9 % (ref 11.5–15.5)
WBC: 5 10*3/uL (ref 4.0–10.5)

## 2014-02-08 NOTE — Assessment & Plan Note (Signed)
The patient's symptoms are consistent with class III angina in spite of treatment with a beta blocker. He has multiple risk factors for coronary artery disease as outlined above. I discussed different management options with him including proceeding with a stress test and if abnormal proceeding with cardiac catheterization versus proceeding directly with cardiac catheterization. Given the very high test probability for obstructive coronary artery disease, I favor the second approach. I discussed the procedure in details as well as the risks and benefits. He is agreeable to proceed and will schedule the procedure for tomorrow.

## 2014-02-08 NOTE — Assessment & Plan Note (Addendum)
Pt with concerning symptoms of DOE. ? Ischemia. Currently chest pain free and without symptom.  I have referred him urgently to cardiology, appt made for 10/6. He likely will need a stress test. I have discussed this in detail with him and his wife today. He is in understanding and agreeable.  Prescribed nitro, discussed CP protocol for nitro and discussed red flags and when to seek urgent care.  Provided work excuse for today and tomorrow. Future excuses will need to come from cardiology, once evaluated.

## 2014-02-08 NOTE — Assessment & Plan Note (Signed)
Lab Results  Component Value Date   CHOL 118 11/17/2013   HDL 25* 11/17/2013   LDLCALC 56 11/17/2013   TRIG 187* 11/17/2013   CHOLHDL 4.7 11/17/2013   continue treatment with atorvastatin.

## 2014-02-08 NOTE — Assessment & Plan Note (Signed)
Blood pressure is elevated but he seems to be anxious. I will consider increasing the dose of carvedilol or adding another antihypertensive medication.

## 2014-02-08 NOTE — Progress Notes (Signed)
HPI  This is a 63 year old male who was referred for evaluation of chest pain. He has known history of chronic diastolic heart failure diagnosed about 2 years ago, type 2 diabetes, hypertension, hyperlipidemia, previous tobacco use and obesity. Over the last 3 weeks, he has noticed progressive symptoms of substernal chest tightness associated with dizziness. This has been happening with light activities such as going up one flight of stairs. The chest pain resolves after about 3 minutes of rest. However, he did have recent prolonged episode that lasted more than 10 minutes and was of concern to him.  His last A1c was 7.3.  He has no past history of MI. Echo 01/24/2014: LV 50-55%. LVH. Grade 1 diastolic. Trivial aortic regurg. Mild mitral regurg, mildly dilated left atrium.  Aorta mildly dilated.  He states his job requires him to walk up and down flights of stairs and he feels the chest pain/dyspnea has progressively been getting worse climbing stairs over the last few weeks.   Allergies  Allergen Reactions  . Benadryl [Diphenhydramine Hcl]     Not really sure if has allergy; but past concern may have caused lip swelling   . Lisinopril Swelling     Current Outpatient Prescriptions on File Prior to Visit  Medication Sig Dispense Refill  . aspirin EC 81 MG EC tablet Take 1 tablet (81 mg total) by mouth daily.  30 tablet  0  . atorvastatin (LIPITOR) 40 MG tablet TAKE ONE TABLET BY MOUTH DAILY AT 6 PM.  30 tablet  11  . carvedilol (COREG) 3.125 MG tablet Take 1 tablet (3.125 mg total) by mouth 2 (two) times daily with a meal.  90 tablet  1  . furosemide (LASIX) 20 MG tablet Take 1 tablet (20 mg total) by mouth 2 (two) times daily.  30 tablet  11  . metFORMIN (GLUCOPHAGE) 500 MG tablet Take 1 tablet (500 mg total) by mouth 2 (two) times daily with a meal.  60 tablet  11  . nitroGLYCERIN (NITROSTAT) 0.4 MG SL tablet Place 1 tablet (0.4 mg total) under the tongue every 5 (five) minutes as needed for  chest pain.  10 tablet  0   No current facility-administered medications on file prior to visit.     Past Medical History  Diagnosis Date  . Hypertension   . CHF (congestive heart failure)   . Diabetes mellitus without complication   . Obesity (BMI 30-39.9)      Past Surgical History  Procedure Laterality Date  . Tumor removal Right 1970    right anterior neck  . Tonsillectomy       No family history on file.   History   Social History  . Marital Status: Married    Spouse Name: N/A    Number of Children: N/A  . Years of Education: N/A   Occupational History  . Not on file.   Social History Main Topics  . Smoking status: Former Smoker    Quit date: 05/10/2012  . Smokeless tobacco: Never Used  . Alcohol Use: No  . Drug Use: No  . Sexual Activity:    Other Topics Concern  . Not on file   Social History Narrative   Works at [employer]; His wife is on custodial staff at [employer]; Son in [place]     ROS A 10 point review of system was performed. It is negative other than that mentioned in the history of present illness.   PHYSICAL EXAM  BP 168/104  Pulse 63  Ht 5\' 10"  (1.778 m)  Wt 251 lb 1.9 oz (113.907 kg)  BMI 36.03 kg/m2  SpO2 99% Constitutional: He is oriented to person, place, and time. He appears well-developed and well-nourished. No distress.  HENT: No nasal discharge.  Head: Normocephalic and atraumatic.  Eyes: Pupils are equal and round.  No discharge. Neck: Normal range of motion. Neck supple. No JVD present. No thyromegaly present.  Cardiovascular: Normal rate, regular rhythm, normal heart sounds. Exam reveals no gallop and no friction rub. No murmur heard.  Pulmonary/Chest: Effort normal and breath sounds normal. No stridor. No respiratory distress. He has no wheezes. He has no rales. He exhibits no tenderness.  Abdominal: Soft. Bowel sounds are normal. He exhibits no distension. There is no tenderness. There is no rebound and no guarding.    Musculoskeletal: Normal range of motion. He exhibits no edema and no tenderness.  Neurological: He is alert and oriented to person, place, and time. Coordination normal.  Skin: Skin is warm and dry. No rash noted. He is not diaphoretic. No erythema. No pallor.  Psychiatric: He has a normal mood and affect. His behavior is normal. Judgment and thought content normal.       EKG: NSR with a PAC, LVH.    ASSESSMENT AND PLAN

## 2014-02-08 NOTE — Patient Instructions (Signed)
Your physician has requested that you have a cardiac catheterization. Cardiac catheterization is used to diagnose and/or treat various heart conditions. Doctors may recommend this procedure for a number of different reasons. The most common reason is to evaluate chest pain. Chest pain can be a symptom of coronary artery disease (CAD), and cardiac catheterization can show whether plaque is narrowing or blocking your heart's arteries. This procedure is also used to evaluate the valves, as well as measure the blood flow and oxygen levels in different parts of your heart. For further information please visit [email or web address]. Please follow instruction sheet, as given.  Your physician recommends that you have lab work today: BMP, CBC and PT/INR

## 2014-02-09 ENCOUNTER — Encounter (HOSPITAL_COMMUNITY)
Admission: RE | Disposition: A | Discharge status: Home / Self Care | Payer: Self-pay | Source: Ambulatory Visit | Attending: Cardiovascular Disease

## 2014-02-09 ENCOUNTER — Ambulatory Visit (HOSPITAL_COMMUNITY)
Admission: RE | Admit: 2014-02-09 | Discharge: 2014-02-09 | Disposition: A | Discharge status: Home / Self Care | Payer: BC Managed Care – PPO | Source: Ambulatory Visit | Attending: Cardiovascular Disease | Admitting: Cardiovascular Disease

## 2014-02-09 ENCOUNTER — Other Ambulatory Visit: Payer: Self-pay | Admitting: Cardiovascular Disease

## 2014-02-09 DIAGNOSIS — I25118 Atherosclerotic heart disease of native coronary artery with other forms of angina pectoris: Secondary | ICD-10-CM

## 2014-02-09 DIAGNOSIS — Z7982 Long term (current) use of aspirin: Secondary | ICD-10-CM | POA: Diagnosis not present

## 2014-02-09 DIAGNOSIS — Z87891 Personal history of nicotine dependence: Secondary | ICD-10-CM | POA: Diagnosis not present

## 2014-02-09 DIAGNOSIS — I5032 Chronic diastolic (congestive) heart failure: Secondary | ICD-10-CM | POA: Diagnosis not present

## 2014-02-09 DIAGNOSIS — I25119 Atherosclerotic heart disease of native coronary artery with unspecified angina pectoris: Secondary | ICD-10-CM | POA: Diagnosis not present

## 2014-02-09 DIAGNOSIS — I10 Essential (primary) hypertension: Secondary | ICD-10-CM | POA: Diagnosis not present

## 2014-02-09 DIAGNOSIS — E669 Obesity, unspecified: Secondary | ICD-10-CM | POA: Diagnosis not present

## 2014-02-09 DIAGNOSIS — Z6836 Body mass index (BMI) 36.0-36.9, adult: Secondary | ICD-10-CM | POA: Insufficient documentation

## 2014-02-09 DIAGNOSIS — I209 Angina pectoris, unspecified: Secondary | ICD-10-CM | POA: Diagnosis present

## 2014-02-09 DIAGNOSIS — R079 Chest pain, unspecified: Secondary | ICD-10-CM

## 2014-02-09 DIAGNOSIS — E119 Type 2 diabetes mellitus without complications: Secondary | ICD-10-CM | POA: Insufficient documentation

## 2014-02-09 DIAGNOSIS — E785 Hyperlipidemia, unspecified: Secondary | ICD-10-CM | POA: Diagnosis not present

## 2014-02-09 HISTORY — PX: LEFT HEART CATHETERIZATION WITH CORONARY ANGIOGRAM: SHX5451

## 2014-02-09 LAB — GLUCOSE, CAPILLARY: GLUCOSE-CAPILLARY: 142 mg/dL — AB (ref 70–99)

## 2014-02-09 SURGERY — LEFT HEART CATHETERIZATION WITH CORONARY ANGIOGRAM
Anesthesia: LOCAL

## 2014-02-09 MED ORDER — NITROGLYCERIN 1 MG/10 ML FOR IR/CATH LAB
INTRA_ARTERIAL | Status: AC
Start: 2014-02-09 — End: 2014-02-09
  Filled 2014-02-09: qty 10

## 2014-02-09 MED ORDER — ASPIRIN 81 MG PO CHEW: 81.0000 mg | CHEWABLE_TABLET | ORAL | Status: DC

## 2014-02-09 MED ORDER — CLOPIDOGREL BISULFATE 75 MG PO TABS: 75.0000 mg | ORAL_TABLET | Freq: Every day | ORAL | Status: DC

## 2014-02-09 MED ORDER — SODIUM CHLORIDE 0.9 % IV SOLN: 250.0000 mL | INTRAVENOUS | Status: DC | PRN

## 2014-02-09 MED ORDER — VERAPAMIL HCL 2.5 MG/ML IV SOLN
INTRAVENOUS | Status: AC
  Filled 2014-02-09: qty 2

## 2014-02-09 MED ORDER — HEPARIN (PORCINE) IN NACL 2-0.9 UNIT/ML-% IJ SOLN
INTRAMUSCULAR | Status: AC
  Filled 2014-02-09: qty 1000

## 2014-02-09 MED ORDER — MIDAZOLAM HCL 2 MG/2ML IJ SOLN
INTRAMUSCULAR | Status: AC
  Filled 2014-02-09: qty 2

## 2014-02-09 MED ORDER — HEPARIN SODIUM (PORCINE) 1000 UNIT/ML IJ SOLN
INTRAMUSCULAR | Status: AC
  Filled 2014-02-09: qty 1

## 2014-02-09 MED ORDER — SODIUM CHLORIDE 0.9 % IJ SOLN: 3.0000 mL | INTRAMUSCULAR | Status: DC | PRN

## 2014-02-09 MED ORDER — SODIUM CHLORIDE 0.9 % IV SOLN: INTRAVENOUS | Status: AC

## 2014-02-09 MED ORDER — SODIUM CHLORIDE 0.9 % IV SOLN: INTRAVENOUS | Status: DC

## 2014-02-09 MED ORDER — LIDOCAINE HCL (PF) 1 % IJ SOLN
INTRAMUSCULAR | Status: AC
  Filled 2014-02-09: qty 30

## 2014-02-09 MED ORDER — FENTANYL CITRATE 0.05 MG/ML IJ SOLN
INTRAMUSCULAR | Status: AC
  Filled 2014-02-09: qty 2

## 2014-02-09 MED ORDER — SODIUM CHLORIDE 0.9 % IJ SOLN: 3.0000 mL | Freq: Two times a day (BID) | INTRAMUSCULAR | Status: DC

## 2014-02-09 NOTE — H&P (View-Only) (Signed)
HPI  This is a 63 year old male who was referred for evaluation of chest pain. He has known history of chronic diastolic heart failure diagnosed about 2 years ago, type 2 diabetes, hypertension, hyperlipidemia, previous tobacco use and obesity. Over the last 3 weeks, he has noticed progressive symptoms of substernal chest tightness associated with dizziness. This has been happening with light activities such as going up one flight of stairs. The chest pain resolves after about 3 minutes of rest. However, he did have recent prolonged episode that lasted more than 10 minutes and was of concern to him.  His last A1c was 7.3.  He has no past history of MI. Echo 01/24/2014: LV 50-55%. LVH. Grade 1 diastolic. Trivial aortic regurg. Mild mitral regurg, mildly dilated left atrium.  Aorta mildly dilated.  He states his job requires him to walk up and down flights of stairs and he feels the chest pain/dyspnea has progressively been getting worse climbing stairs over the last few weeks.   Allergies  Allergen Reactions  . Benadryl [Diphenhydramine Hcl]     Not really sure if has allergy; but past concern may have caused lip swelling   . Lisinopril Swelling     Current Outpatient Prescriptions on File Prior to Visit  Medication Sig Dispense Refill  . aspirin EC 81 MG EC tablet Take 1 tablet (81 mg total) by mouth daily.  30 tablet  0  . atorvastatin (LIPITOR) 40 MG tablet TAKE ONE TABLET BY MOUTH DAILY AT 6 PM.  30 tablet  11  . carvedilol (COREG) 3.125 MG tablet Take 1 tablet (3.125 mg total) by mouth 2 (two) times daily with a meal.  90 tablet  1  . furosemide (LASIX) 20 MG tablet Take 1 tablet (20 mg total) by mouth 2 (two) times daily.  30 tablet  11  . metFORMIN (GLUCOPHAGE) 500 MG tablet Take 1 tablet (500 mg total) by mouth 2 (two) times daily with a meal.  60 tablet  11  . nitroGLYCERIN (NITROSTAT) 0.4 MG SL tablet Place 1 tablet (0.4 mg total) under the tongue every 5 (five) minutes as needed for  chest pain.  10 tablet  0   No current facility-administered medications on file prior to visit.     Past Medical History  Diagnosis Date  . Hypertension   . CHF (congestive heart failure)   . Diabetes mellitus without complication   . Obesity (BMI 30-39.9)      Past Surgical History  Procedure Laterality Date  . Tumor removal Right 1970    right anterior neck  . Tonsillectomy       No family history on file.   History   Social History  . Marital Status: Married    Spouse Name: N/A    Number of Children: N/A  . Years of Education: N/A   Occupational History  . Not on file.   Social History Main Topics  . Smoking status: Former Smoker    Quit date: 05/10/2012  . Smokeless tobacco: Never Used  . Alcohol Use: No  . Drug Use: No  . Sexual Activity:    Other Topics Concern  . Not on file   Social History Narrative   Works at [employer]; His wife is on custodial staff at [employer]; Son in [place]     ROS A 10 point review of system was performed. It is negative other than that mentioned in the history of present illness.   PHYSICAL EXAM  BP 168/104  Pulse 63  Ht 5\' 10"  (1.778 m)  Wt 251 lb 1.9 oz (113.907 kg)  BMI 36.03 kg/m2  SpO2 99% Constitutional: He is oriented to person, place, and time. He appears well-developed and well-nourished. No distress.  HENT: No nasal discharge.  Head: Normocephalic and atraumatic.  Eyes: Pupils are equal and round.  No discharge. Neck: Normal range of motion. Neck supple. No JVD present. No thyromegaly present.  Cardiovascular: Normal rate, regular rhythm, normal heart sounds. Exam reveals no gallop and no friction rub. No murmur heard.  Pulmonary/Chest: Effort normal and breath sounds normal. No stridor. No respiratory distress. He has no wheezes. He has no rales. He exhibits no tenderness.  Abdominal: Soft. Bowel sounds are normal. He exhibits no distension. There is no tenderness. There is no rebound and no guarding.    Musculoskeletal: Normal range of motion. He exhibits no edema and no tenderness.  Neurological: He is alert and oriented to person, place, and time. Coordination normal.  Skin: Skin is warm and dry. No rash noted. He is not diaphoretic. No erythema. No pallor.  Psychiatric: He has a normal mood and affect. His behavior is normal. Judgment and thought content normal.       EKG: NSR with a PAC, LVH.    ASSESSMENT AND PLAN

## 2014-02-09 NOTE — Discharge Instructions (Signed)
Radial Site Care °Refer to this sheet in the next few weeks. These instructions provide you with information on caring for yourself after your procedure. Your caregiver may also give you more specific instructions. Your treatment has been planned according to current medical practices, but problems sometimes occur. Call your caregiver if you have any problems or questions after your procedure. °HOME CARE INSTRUCTIONS °· You may shower the day after the procedure. Remove the bandage (dressing) and gently wash the site with plain soap and water. Gently pat the site dry. °· Do not apply powder or lotion to the site. °· Do not submerge the affected site in water for 3 to 5 days. °· Inspect the site at least twice daily. °· Do not flex or bend the affected arm for 24 hours. °· No lifting over 5 pounds (2.3 kg) for 5 days after your procedure. °· Do not drive home if you are discharged the same day of the procedure. Have someone else drive you. °· You may drive 24 hours after the procedure unless otherwise instructed by your caregiver. °· Do not operate machinery or power tools for 24 hours. °· A responsible adult should be with you for the first 24 hours after you arrive home. °What to expect: °· Any bruising will usually fade within 1 to 2 weeks. °· Blood that collects in the tissue (hematoma) may be painful to the touch. It should usually decrease in size and tenderness within 1 to 2 weeks. °SEEK IMMEDIATE MEDICAL CARE IF: °· You have unusual pain at the radial site. °· You have redness, warmth, swelling, or pain at the radial site. °· You have drainage (other than a small amount of blood on the dressing). °· You have chills. °· You have a fever or persistent symptoms for more than 72 hours. °· You have a fever and your symptoms suddenly get worse. °· Your arm becomes pale, cool, tingly, or numb. °· You have heavy bleeding from the site. Hold pressure on the site. °Document Released: 05/25/2010 Document Revised:  07/15/2011 Document Reviewed: 05/25/2010 °[employer]® Patient Information ©2015 [employer]. This information is not intended to replace advice given to you by your health care provider. Make sure you discuss any questions you have with your health care provider. ° °

## 2014-02-09 NOTE — Interval H&P Note (Signed)
Cath Lab Visit (complete for each Cath Lab visit)  Clinical Evaluation Leading to the Procedure:   ACS: No.  Non-ACS:    Anginal Classification: CCS III  Anti-ischemic medical therapy: Minimal Therapy (1 class of medications)  Non-Invasive Test Results: No non-invasive testing performed  Prior CABG: No previous CABG      History and Physical Interval Note:  02/09/2014 9:56 AM  Kyle Ballard  has presented today for surgery, with the diagnosis of cp  The various methods of treatment have been discussed with the patient and family. After consideration of risks, benefits and other options for treatment, the patient has consented to  Procedure(s): LEFT HEART CATHETERIZATION WITH CORONARY ANGIOGRAM (N/A) as a surgical intervention .  The patient's history has been reviewed, patient examined, no change in status, stable for surgery.  I have reviewed the patient's chart and labs.  Questions were answered to the patient's satisfaction.     Kyle Ballard

## 2014-02-09 NOTE — CV Procedure (Signed)
   Cardiac Catheterization Procedure Note  Name: Kyle Ballard MRN: [id number] DOB: [birth year]  Procedure: Left Heart Cath, Selective Coronary Angiography, LV angiography  Indication: class 3 angina  Medications:  Sedation:  2 mg IV Versed, 100 mcg IV Fentanyl  Contrast:  75 Omnipaque   Procedural Details: The right wrist was prepped, draped, and anesthetized with 1% lidocaine. Using the modified Seldinger technique, a 5 French sheath was introduced into the right radial artery. 3 mg of verapamil was administered through the sheath, weight-based unfractionated heparin was administered intravenously. A Jackie catheter was used for selective coronary angiography. A JL 3.5 catheter was used to engage the left main. A pigtail catheter was used for left ventriculography. Catheter exchanges were performed over an exchange length guidewire. There were no immediate procedural complications. A TR band was used for radial hemostasis at the completion of the procedure.  The patient was transferred to the post catheterization recovery area for further monitoring.  Procedural Findings:  Hemodynamics: AO:  142/89   mmHg LV:  147/11    mmHg LVEDP: 17  mmHg  Coronary angiography: Coronary dominance: right   Left Main:  Normal in size with no Significant disease.  Left Anterior Descending (LAD):  Normal in size and mildly calcified. Mild ectasia is noted proximally. The vessel has minor irregularities throughout its course without obstructive disease.  1st diagonal (D1):  Normal in size with diffuse 50% disease proximally.  2nd diagonal (D2):  Small in size with minor irregularities.  3rd diagonal (D3):  Small in size with minor irregularities.  Circumflex (LCx):  Normal in size and nondominant. There is tubular 20% stenosis proximally. The rest of the vessel has minor irregularities.  1st obtuse marginal:  Very small in size with minor irregularities.  2nd obtuse marginal:  Medium in size  with minor irregularities.  3rd obtuse marginal:  Medium in size with minor irregularities.   Ramus Intermedius:  Large in size with 30% proximal stenosis. The vessel has mild ectasia.  Right Coronary Artery: Large in size and dominant. The vessel has severe ectasia and tortuosity throughout its course with TIMI 2 flow. There is a 40% proximal stenosis.  Posterior descending artery: Relatively small in size with 60% mid stenosis.  Posterior AV segment: Ectatic with minor irregularities.  Posterolateral branchs:  PL 1 is large with diffuse 50% mid stenosis. PLT was relatively small.  Left ventriculography: Left ventricular systolic function is normal , LVEF is estimated at 55 %, there is no significant mitral regurgitation   Final Conclusions:   1. Mild nonobstructive coronary artery disease. Significant coronary ectasia especially involving the right coronary artery with sluggish TIMI 2 flow. 2. Mildly elevated left ventricular end-diastolic pressure. 3. Normal LV systolic function.  Recommendations:  Recommend aggressive medical therapy. I added Plavix to aspirin. Advance antianginal therapy as tolerated.  Kathlyn Sacramento MD, [facility] 02/09/2014, 10:35 AM

## 2014-02-15 ENCOUNTER — Telehealth: Payer: Self-pay | Admitting: Cardiovascular Disease

## 2014-02-15 NOTE — Telephone Encounter (Signed)
I spoke with the pt and he did not start his plavix until yesterday (cath 10/7).  The pt has not taken his plavix today because he noticed a bruise on his arm. The pt does not remember any injury to this area.  I asked the pt if this was the location of his IV and he said yes.  I made the pt aware that this bruise is most likely related to his IV during hospitalization. I instructed the pt to continue ASA and plavix at this time and contact the office if he notices further issues with bruising or bleeding. Pt agreed with plan.

## 2014-02-15 NOTE — Telephone Encounter (Signed)
New message     Pt took 1 pill of plavix and noticed his arm has red bruising.  Should he continue to take the plavix?

## 2014-02-18 ENCOUNTER — Other Ambulatory Visit: Payer: Self-pay

## 2014-03-26 ENCOUNTER — Other Ambulatory Visit: Payer: Self-pay | Admitting: Family Medicine

## 2014-04-14 ENCOUNTER — Encounter (HOSPITAL_COMMUNITY): Payer: Self-pay | Admitting: Cardiovascular Disease

## 2014-05-24 ENCOUNTER — Other Ambulatory Visit: Payer: Self-pay | Admitting: Family Medicine

## 2014-05-24 DIAGNOSIS — I5042 Chronic combined systolic (congestive) and diastolic (congestive) heart failure: Secondary | ICD-10-CM

## 2014-07-21 ENCOUNTER — Ambulatory Visit (INDEPENDENT_AMBULATORY_CARE_PROVIDER_SITE_OTHER): Payer: BC Managed Care – PPO | Admitting: Family Medicine

## 2014-07-21 VITALS — BP 146/87 | HR 75 | Temp 98.0°F | Wt 245.5 lb

## 2014-07-21 DIAGNOSIS — K5900 Constipation, unspecified: Secondary | ICD-10-CM

## 2014-07-21 DIAGNOSIS — Z0000 Encounter for general adult medical examination without abnormal findings: Secondary | ICD-10-CM | POA: Insufficient documentation

## 2014-07-21 DIAGNOSIS — K602 Anal fissure, unspecified: Secondary | ICD-10-CM

## 2014-07-21 MED ORDER — POLYETHYLENE GLYCOL 3350 17 GM/SCOOP PO POWD: 17.0000 g | Freq: Two times a day (BID) | ORAL | Status: DC | PRN

## 2014-07-21 NOTE — Patient Instructions (Signed)
Constipation  Constipation is when a person has fewer than three bowel movements a week, has difficulty having a bowel movement, or has stools that are dry, hard, or larger than normal. As people grow older, constipation is more common. If you try to fix constipation with medicines that make you have a bowel movement (laxatives), the problem may get worse. Long-term laxative use may cause the muscles of the colon to become weak. A low-fiber diet, not taking in enough fluids, and taking certain medicines may make constipation worse.   CAUSES   · Certain medicines, such as antidepressants, pain medicine, iron supplements, antacids, and water pills.    · Certain diseases, such as diabetes, irritable bowel syndrome (IBS), thyroid disease, or depression.    · Not drinking enough water.    · Not eating enough fiber-rich foods.    · Stress or travel.    · Lack of physical activity or exercise.    · Ignoring the urge to have a bowel movement.    · Using laxatives too much.    SIGNS AND SYMPTOMS   · Having fewer than three bowel movements a week.    · Straining to have a bowel movement.    · Having stools that are hard, dry, or larger than normal.    · Feeling full or bloated.    · Pain in the lower abdomen.    · Not feeling relief after having a bowel movement.    DIAGNOSIS   Your health care provider will take a medical history and perform a physical exam. Further testing may be done for severe constipation. Some tests may include:  · A barium enema X-ray to examine your rectum, colon, and, sometimes, your small intestine.    · A sigmoidoscopy to examine your lower colon.    · A colonoscopy to examine your entire colon.  TREATMENT   Treatment will depend on the severity of your constipation and what is causing it. Some dietary treatments include drinking more fluids and eating more fiber-rich foods. Lifestyle treatments may include regular exercise. If these diet and lifestyle recommendations do not help, your health care  provider may recommend taking over-the-counter laxative medicines to help you have bowel movements. Prescription medicines may be prescribed if over-the-counter medicines do not work.   HOME CARE INSTRUCTIONS   · Eat foods that have a lot of fiber, such as fruits, vegetables, whole grains, and beans.  · Limit foods high in fat and processed sugars, such as french fries, hamburgers, cookies, candies, and soda.    · A fiber supplement may be added to your diet if you cannot get enough fiber from foods.    · Drink enough fluids to keep your urine clear or pale yellow.    · Exercise regularly or as directed by your health care provider.    · Go to the restroom when you have the urge to go. Do not hold it.    · Only take over-the-counter or prescription medicines as directed by your health care provider. Do not take other medicines for constipation without talking to your health care provider first.    SEEK IMMEDIATE MEDICAL CARE IF:   · You have bright red blood in your stool.    · Your constipation lasts for more than 4 days or gets worse.    · You have abdominal or rectal pain.    · You have thin, pencil-like stools.    · You have unexplained weight loss.  MAKE SURE YOU:   · Understand these instructions.  · Will watch your condition.  · Will get help right away if you are not   you have with your health care provider. Anal Fissure, Adult An anal fissure is a small tear or crack in the skin around the anus. Bleeding from a fissure usually stops on its own within a few minutes. However, bleeding will often reoccur with each bowel movement until the crack heals.  CAUSES   Passing large, hard stools.  Frequent diarrheal  stools.  Constipation.  Inflammatory bowel disease (Crohn's disease or ulcerative colitis).  Infections.  Anal sex. SYMPTOMS   Small amounts of blood seen on your stools, on toilet paper, or in the toilet after a bowel movement.  Rectal bleeding.  Painful bowel movements.  Itching or irritation around the anus. DIAGNOSIS Your caregiver will examine the anal area. An anal fissure can usually be seen with careful inspection. A rectal exam may be performed and a short tube (anoscope) may be used to examine the anal canal. TREATMENT   You may be instructed to take fiber supplements. These supplements can soften your stool to help make bowel movements easier.  Sitz baths may be recommended to help heal the tear. Do not use soap in the sitz baths.  A medicated cream or ointment may be prescribed to lessen discomfort. HOME CARE INSTRUCTIONS   Maintain a diet high in fruits, whole grains, and vegetables. Avoid constipating foods like bananas and dairy products.  Take sitz baths as directed by your caregiver.  Drink enough fluids to keep your urine clear or pale yellow.  Only take over-the-counter or prescription medicines for pain, discomfort, or fever as directed by your caregiver. Do not take aspirin as this may increase bleeding.  Do not use ointments containing numbing medications (anesthetics) or hydrocortisone. They could slow healing. SEEK MEDICAL CARE IF:   Your fissure is not completely healed within 3 days.  You have further bleeding.  You have a fever.  You have diarrhea mixed with blood.  You have pain.  Your problem is getting worse rather than better. MAKE SURE YOU:   Understand these instructions.  Will watch your condition.  Will get help right away if you are not doing well or get worse. Document Released: 04/22/2005 Document Revised: 07/15/2011 Document Reviewed: 10/07/2010 [facility] Patient Information 2015 [place], [place]. This information is not  intended to replace advice given to you by your health care provider. Make sure you discuss any questions you have with your health care provider.   Follow-up in 2-4 weeks if still having symptoms. Until empty states Miralax at least daily to keep stool softening.  Keep A& D or Neosporin an anal fissure. Make an appointment for your colonoscopy !!!!!!!

## 2014-07-21 NOTE — Assessment & Plan Note (Signed)
Patient given prescription for Mira lax, advised to use 1 capful daily in the morning. He can titrate his Mira lax use depending upon his result, would like him to have soft but not watery stools for the next 4 weeks to allow anal fissure healing. Apply A&D or Neosporin ointment to anal fissure few times a day. Strongly encouraged him to make screening colonoscopy appointment, and gave him information on gastroenterologist in the area. Follow-up in 2-4 weeks, sooner if needed

## 2014-07-21 NOTE — Progress Notes (Signed)
   Subjective:    Patient ID: Kyle Ballard, male    DOB: July 01, 1950, 64 y.o.   MRN: [id number]  HPI  Constipation: Patient presents to family medicine clinic after 3 weeks ago having experienced constipation, with abdominal straining. He states that he used to have a daily bowel movement every morning until about 3 weeks ago in which he is having lower abdominal fullness with decreased bowel movements. He states that he went 3-4 days without a bowel movement, he took some magnesium citrate and he was able to pass some hard stool. He states that he did have a very small amount of bleeding on his toilet tissue after this hard bowel movement, but has not had any since. He does admit to possible darkened stool at that time as well but it has since resolved. He states that his bowel movements are more regular now, but they are still not daily and back to his normal regimen. Had mild decreased appetite. He denies any nausea or vomiting. He denies any hemorrhoid history. He denies itching or burning of the rectum. He has no family history of colon cancer.  Former Smoker  Past Medical History  Diagnosis Date  . Hypertension   . CHF (congestive heart failure)   . Diabetes mellitus without complication   . Obesity (BMI 30-39.9)    Allergies  Allergen Reactions  . Lisinopril Swelling  . Benadryl [Diphenhydramine Hcl] Other (See Comments)    Not really sure if has allergy; but past concern may have caused lip swelling     Review of Systems Per HPI    Objective:   Physical Exam BP 146/87 mmHg  Pulse 75  Temp(Src) 98 F (36.7 C) (Oral)  Wt 245 lb 8 oz (111.358 kg) Gen: NAD. Nontoxic, appears VERY well, happy, African-American male. Well-developed well-nourished, mildly obese. CV: RRR  Abd: Soft. Round. NTND. BS present. No Masses palpated.  GU: No erythema, no soft tissue swelling. Small external hemorrhoid present. Anal fissure at 12 o'clock.  FOBT negative    Assessment & Plan:

## 2014-07-21 NOTE — Assessment & Plan Note (Signed)
Patient strongly encouraged to schedule screening colonoscopy

## 2014-08-05 ENCOUNTER — Telehealth: Payer: Self-pay | Admitting: Family Medicine

## 2014-08-05 DIAGNOSIS — R198 Other specified symptoms and signs involving the digestive system and abdomen: Secondary | ICD-10-CM

## 2014-08-05 DIAGNOSIS — R109 Unspecified abdominal pain: Secondary | ICD-10-CM

## 2014-08-05 NOTE — Telephone Encounter (Signed)
Pt wife calling because the pt has a scheduled appt for a colonoscopy in June but he is having problems and was wanting to know if there is anything else that can be done to speed up the process. Please call pt's wife with info or advice / thanks [employer], ASA

## 2014-08-05 NOTE — Telephone Encounter (Signed)
Tried to contact pt or his wife to discuss below but the phone only rang with no VM available. Katharina Caper, April D

## 2014-08-10 NOTE — Telephone Encounter (Signed)
Spoke with pt wife and told her to contact the place where her husbands colonoscopy is scheduled and see if they had any earlier openings.  She stated that she did call and they did not and she stated that she told them to put her on a cancellation list so there might be an opportunity to come in earlier. She understood. Will forward to doctor to see if there is any other options. Katharina Caper, April D

## 2014-08-11 NOTE — Telephone Encounter (Signed)
Having pain in his stomach at night or when he wakes up in the morning. Goes away when he is up and moving around. Not intense. Feel bloated even though he hasn't eaten anything. Drinking water seems to make it worse. Intermittent in nature. Periumbilical in nature. Having bowel movement now and they are thin as compared to what they were before the miralax. If he is having worsening pain then he should f/u or get seen immediately.   Rosemarie Ax, MD PGY-2, [facility] 08/11/2014, 8:57 AM

## 2014-08-12 NOTE — Telephone Encounter (Signed)
In a patient with changes in bowel movements and abdomen pain and never had a colonoscopy and based on his age, I will refer to GI for diagnostic colonoscopy. He has a screening one scheduled in June but he should be seen before that.   I called patient and informed him of the plan and trying to obtain a diagnostic colonoscopy.   Rosemarie Ax, MD PGY-2, [facility] 08/12/2014, 9:14 AM

## 2014-09-12 ENCOUNTER — Telehealth: Payer: Self-pay | Admitting: Family Medicine

## 2014-09-12 NOTE — Telephone Encounter (Signed)
Pt informed. Kyle Ballard  

## 2014-09-12 NOTE — Telephone Encounter (Signed)
Spoke with patient's wife. I asked her the reasons for the FMLA paperwork  FMLA paperwork is now completed and will be placed up front for her to pick up.   Rosemarie Ax, MD PGY-2, [facility] 09/12/2014, 12:41 PM

## 2014-09-13 ENCOUNTER — Other Ambulatory Visit: Payer: Self-pay | Admitting: Cardiovascular Disease

## 2014-09-13 ENCOUNTER — Other Ambulatory Visit: Payer: Self-pay | Admitting: Family Medicine

## 2014-09-13 DIAGNOSIS — I5042 Chronic combined systolic (congestive) and diastolic (congestive) heart failure: Secondary | ICD-10-CM

## 2014-10-05 ENCOUNTER — Encounter: Payer: BC Managed Care – PPO | Admitting: Gastroenterology

## 2014-10-11 ENCOUNTER — Encounter: Payer: Self-pay | Admitting: Physician Assistant

## 2014-10-11 ENCOUNTER — Ambulatory Visit (INDEPENDENT_AMBULATORY_CARE_PROVIDER_SITE_OTHER): Payer: BC Managed Care – PPO | Admitting: Physician Assistant

## 2014-10-11 VITALS — BP 142/100 | HR 60 | Ht 68.0 in | Wt 244.5 lb

## 2014-10-11 DIAGNOSIS — Z7902 Long term (current) use of antithrombotics/antiplatelets: Secondary | ICD-10-CM | POA: Diagnosis not present

## 2014-10-11 DIAGNOSIS — Z1211 Encounter for screening for malignant neoplasm of colon: Secondary | ICD-10-CM | POA: Diagnosis not present

## 2014-10-11 DIAGNOSIS — R194 Change in bowel habit: Secondary | ICD-10-CM

## 2014-10-11 MED ORDER — PEG-KCL-NACL-NASULF-NA ASC-C 100 G PO SOLR: 1.0000 | Freq: Once | ORAL | Status: DC

## 2014-10-11 NOTE — Progress Notes (Signed)
Patient ID: Kyle Ballard, male   DOB: 1950/08/31, 64 y.o.   MRN: [id number]    HPI:  Kyle Ballard is a 64 y.o.   male referred by Rosemarie Ax, MD for evaluation for colonoscopy due to a change in bowel habits.    patient has a history of hyperlipidemia, type 2 diabetes, hypertension, tobacco abuse, obesity, and chronic diastolic heart failure. He has no past history of MI. His last echocardiogram was in September 2015 ejection fraction was 50-55%. He did have LVH, trivial  Aortic regurgitation, mild mitral regurgitation and a mildly dilated left atrium. He denies recent chest pain or dyspnea on exertion.  He had a cardiac cath on 02/09/2014  Which showed mild nonobstructive coronary artery disease. Significant coronary ectasia especially involving the right coronary artery with sluggish TIMI 2 flow. Mildly elevated left ventricular end-diastolic pressure and normal LV systolic function.  Based on the findings of his coronary angiography Plavix was added to his aspirin regimen in October 2015.   patient states that up until about 6 or 7 months ago he had a normal, formed bowel movement on a daily basis. Approximately 6-7 months ago he began to skip a day or 2 between bowel movements and sometimes passed hard stools. He began using Mira lax with varying degrees of relief. He reports that for the past 3-4 months his stools have been pencil thin and broken into pieces. He has no bright red blood per rectum or melena. His appetite has been "so-so" and he has lost 6 or 7 pounds in the past several months. He feels his weight has stabilized over the past month however. He does have crampy lower abdominal pain prior to defecation, relieved with defecation. His pain often starts postprandially  And lasts until he can pass gas or have a bowel movement. He denies a family history of colon cancer, colon polyps, or inflammatory bowel disease. He denies epigastric pain, nausea, vomiting, early satiety, or dysphagia.  He reports that he recently had a fecal occult test at his primary care providers and his stool was negative for blood.   Past Medical History  Diagnosis Date  . Hypertension   . CHF (congestive heart failure)   . Diabetes mellitus without complication   . Obesity (BMI 30-39.9)   . HLD (hyperlipidemia)   . Anal fissure     Past Surgical History  Procedure Laterality Date  . Tumor removal Right 1970    right anterior neck  . Tonsillectomy    . Left heart catheterization with coronary angiogram N/A 02/09/2014    Procedure: LEFT HEART CATHETERIZATION WITH CORONARY ANGIOGRAM;  Surgeon: Wellington Hampshire, MD;  Location: [facility];  Service: Cardiovascular;  Laterality: N/A;   Family History  Problem Relation Age of Onset  . Heart disease Mother   . Heart failure Mother     CHF  . Hypertension Brother    History  Substance Use Topics  . Smoking status: Current Some Day Smoker    Types: Cigarettes  . Smokeless tobacco: Never Used  . Alcohol Use: No   Current Outpatient Prescriptions  Medication Sig Dispense Refill  . aspirin EC 81 MG EC tablet Take 1 tablet (81 mg total) by mouth daily. 30 tablet 0  . atorvastatin (LIPITOR) 40 MG tablet Take 40 mg by mouth daily.    . carvedilol (COREG) 3.125 MG tablet TAKE ONE TABLET BY MOUTH TWICE DAILY WITH A MEAL 90 tablet 0  . clopidogrel (PLAVIX) 75 MG tablet  TAKE ONE TABLET BY MOUTH ONCE DAILY 30 tablet 3  . furosemide (LASIX) 20 MG tablet Take 20 mg by mouth daily.    . metFORMIN (GLUCOPHAGE) 500 MG tablet Take 1 tablet (500 mg total) by mouth 2 (two) times daily with a meal. 60 tablet 11  . polyethylene glycol powder (GLYCOLAX/MIRALAX) powder Take 17 g by mouth 2 (two) times daily as needed. 3350 g 1  . nitroGLYCERIN (NITROSTAT) 0.4 MG SL tablet Place 1 tablet (0.4 mg total) under the tongue every 5 (five) minutes as needed for chest pain. (Patient not taking: Reported on 10/11/2014) 10 tablet 0   No current facility-administered  medications for this visit.   Allergies  Allergen Reactions  . Lisinopril Swelling  . Benadryl [Diphenhydramine Hcl] Other (See Comments)    Not really sure if has allergy; but past concern may have caused lip swelling      Review of Systems: Gen: Denies any fever, chills, sweats, anorexia, fatigue, weakness, malaise, weight loss, and sleep disorder CV: Denies chest pain, angina, palpitations, syncope, orthopnea, PND, peripheral edema, and claudication. Resp: Denies dyspnea at rest, dyspnea with exercise, cough, sputum, wheezing, coughing up blood, and pleurisy. GI: Denies vomiting blood, jaundice, and fecal incontinence.   Denies dysphagia or odynophagia. GU : Denies urinary burning, blood in urine, urinary frequency, urinary hesitancy, nocturnal urination, and urinary incontinence. MS: Denies joint pain, limitation of movement, and swelling, stiffness, low back pain, extremity pain. Denies muscle weakness, cramps, atrophy.  Derm: Denies rash, itching, dry skin, hives, moles, warts, or unhealing ulcers.  Psych: Denies depression, anxiety, memory loss, suicidal ideation, hallucinations, paranoia, and confusion. Heme: Denies bruising, bleeding, and enlarged lymph nodes. Neuro:  Denies any headaches, dizziness, paresthesias. Endo:  Denies any problems with DM, thyroid, adrenal function   LAB RESULTS:  CBC 02/08/2014 White blood cell 5, platelets 327,000, hemoglobin 13.9, hematocrit 42.2 , MCV 89.2.   Physical Exam: BP 142/100 mmHg  Pulse 60  Ht 5\' 8"  (1.727 m)  Wt 244 lb 8 oz (110.904 kg)  BMI 37.18 kg/m2 Constitutional: Pleasant,well-developed  African-Americanmale in no acute distress. HEENT: Normocephalic and atraumatic. Conjunctivae are normal. No scleral icterus. Neck supple.  No JVD , scar noted anterior to right carotid Cardiovascular: Normal rate, regular rhythm.  Pulmonary/chest: Effort normal and breath sounds normal. No wheezing, rales or rhonchi. Abdominal: Soft,  nondistended, nontender. Bowel sounds active throughout. There are no masses palpable. No hepatomegaly. Extremities: no edema Lymphadenopathy: No cervical adenopathy noted. Neurological: Alert and oriented to person place and time. Skin: Skin is warm and dry. No rashes noted. Psychiatric: Normal mood and affect. Behavior is normal.  ASSESSMENT AND PLAN:  64 year old male with a 6 month history of change in bowel habits referred for evaluation. Patient has been instructed to try to increase fiber in his diet and add P fruits to his diet. He has been instructed that he may titrate the dose of Mira lax that he uses on a daily basis to achieve the desired effect. He will be scheduled for a colonoscopy to screen for polyps or neoplasia.The risks, benefits, and alternatives to colonoscopy with possible biopsy and possible polypectomy were discussed with the patient and they consent to proceed. Hold plavix  5 days before procedure - will instruct when and how to resume after procedure. Risks and benefits of procedure including bleeding, perforation, infection, missed lesions, medication reactions and possible hospitalization or surgery if complications occur explained. Additional rare but real risk of cardiovascular event such as heart  attack or ischemia/infarct of other organs off plavix explained and need to seek urgent help if this occurs. Will communicate by phone or EMR with patient's prescribing provider that to confirm holding plavix is reasonable in this case.     Jabree Rebert, Deloris Ping 10/11/2014, 10:16 AM  CC: Rosemarie Ax, MD

## 2014-10-11 NOTE — Progress Notes (Signed)
I agree with the above note, plan 

## 2014-10-11 NOTE — Patient Instructions (Signed)
You have been scheduled for a colonoscopy. Please follow written instructions given to you at your visit today.  Please pick up your prep supplies at the pharmacy within the next 1-3 days. If you use inhalers (even only as needed), please bring them with you on the day of your procedure. Your physician has requested that you go to www.startemmi.com and enter the access code given to you at your visit today. This web site gives a general overview about your procedure. However, you should still follow specific instructions given to you by our office regarding your preparation for the procedure.   Use Miralax as directed Use "P" Fruits  Plums,pears,pinapple

## 2014-10-13 ENCOUNTER — Telehealth: Payer: Self-pay | Admitting: *Deleted

## 2014-10-13 NOTE — Telephone Encounter (Signed)
  10/13/2014   RE: French Kendra DOB: 12-05-1950 MRN: [id number]   Dear  Dr Fletcher Anon,   We have scheduled the above patient for an endoscopic procedure. Our records show that he is on anticoagulation therapy.   Please advise as to how long the patient may come off his therapy of Colonoscopy prior to the procedure, which is scheduled for 12/13/2014.  Please fax back/ or route the completed form to [facility] at [phone].   Sincerely,    Genella Mech

## 2014-10-14 NOTE — Telephone Encounter (Signed)
Patient called back. He received his message

## 2014-10-14 NOTE — Telephone Encounter (Signed)
L/M for patient that it is ok to HOLD plavix 7 days before procedure and to contact the office if he has any questions

## 2014-10-14 NOTE — Telephone Encounter (Signed)
Plavix can be stopped 1 week before colonoscopy and should be resumed once safe from a bleeding standpoint.  Dr. Fletcher Anon.

## 2014-10-31 ENCOUNTER — Other Ambulatory Visit: Payer: Self-pay

## 2014-11-11 ENCOUNTER — Other Ambulatory Visit: Payer: Self-pay | Admitting: Family Medicine

## 2014-11-22 ENCOUNTER — Other Ambulatory Visit: Payer: Self-pay | Admitting: Family Medicine

## 2014-11-22 DIAGNOSIS — I5042 Chronic combined systolic (congestive) and diastolic (congestive) heart failure: Secondary | ICD-10-CM

## 2014-12-12 ENCOUNTER — Telehealth: Payer: Self-pay | Admitting: Gastroenterology

## 2014-12-12 DIAGNOSIS — Z1211 Encounter for screening for malignant neoplasm of colon: Secondary | ICD-10-CM

## 2014-12-12 MED ORDER — PEG-KCL-NACL-NASULF-NA ASC-C 100 G PO SOLR: 1.0000 | Freq: Once | ORAL | Status: DC

## 2014-12-12 NOTE — Telephone Encounter (Signed)
rx resent as requested.

## 2014-12-13 ENCOUNTER — Ambulatory Visit (AMBULATORY_SURGERY_CENTER): Payer: BC Managed Care – PPO | Admitting: Gastroenterology

## 2014-12-13 ENCOUNTER — Encounter: Payer: Self-pay | Admitting: Gastroenterology

## 2014-12-13 VITALS — BP 115/73 | HR 64 | Temp 95.0°F | Resp 16 | Ht 68.0 in | Wt 244.0 lb

## 2014-12-13 DIAGNOSIS — D127 Benign neoplasm of rectosigmoid junction: Secondary | ICD-10-CM

## 2014-12-13 DIAGNOSIS — Z1211 Encounter for screening for malignant neoplasm of colon: Secondary | ICD-10-CM | POA: Diagnosis not present

## 2014-12-13 DIAGNOSIS — D129 Benign neoplasm of anus and anal canal: Secondary | ICD-10-CM

## 2014-12-13 DIAGNOSIS — D125 Benign neoplasm of sigmoid colon: Secondary | ICD-10-CM

## 2014-12-13 DIAGNOSIS — K621 Rectal polyp: Secondary | ICD-10-CM

## 2014-12-13 DIAGNOSIS — D128 Benign neoplasm of rectum: Secondary | ICD-10-CM

## 2014-12-13 LAB — GLUCOSE, CAPILLARY
GLUCOSE-CAPILLARY: 97 mg/dL (ref 65–99)
Glucose-Capillary: 93 mg/dL (ref 65–99)

## 2014-12-13 MED ORDER — SODIUM CHLORIDE 0.9 % IV SOLN: 500.0000 mL | INTRAVENOUS | Status: DC

## 2014-12-13 NOTE — Progress Notes (Signed)
Called to room to assist during endoscopic procedure.  Patient ID and intended procedure confirmed with present staff. Received instructions for my participation in the procedure from the performing physician.  

## 2014-12-13 NOTE — Op Note (Signed)
[facility]  [address] [place], [id number]   COLONOSCOPY PROCEDURE REPORT  PATIENT: Kyle Ballard, Kyle Ballard  MR#: [id number] BIRTHDATE: [birth year] , 64  yrs. old GENDER: male ENDOSCOPIST: Milus Banister, MD PROCEDURE DATE:  12/13/2014 PROCEDURE:   Colonoscopy with biopsy and Colonoscopy with snare polypectomy First Screening Colonoscopy - Avg.  risk and is 50 yrs.  old or older - No.  Prior Negative Screening - Now for repeat screening. N/A  History of Adenoma - Now for follow-up colonoscopy & has been > or = to 3 yrs.  N/A  Polyps removed today? Yes ASA CLASS:   Class II INDICATIONS:change in bowels. MEDICATIONS: Monitored anesthesia care and Propofol 375 mg IV  DESCRIPTION OF PROCEDURE:   After the risks benefits and alternatives of the procedure were thoroughly explained, informed consent was obtained.  The digital rectal exam revealed no abnormalities of the rectum.   The [id number]  endoscope was introduced through the anus and advanced to the cecum with good visualization of the IC valve and appendiceal orifice. No adverse events experienced.   The quality of the prep was adequate  The instrument was then slowly withdrawn as the colon was fully examined. Estimated blood loss is zero unless otherwise noted in this procedure report.  COLON FINDINGS: There were numerous left sided diverticulum.  One pedunculated 1cm polyp in proximal rectum was removed with snare/cautery (jar 2).  There were numerous flat rectosigmoid polyps (15-20) ranging in size from 7mm to 72mm.  One was removed with piecemeal snare/cautery (jar 1).  The rest seemed more hyperplastic appearing and were sampled with biopsy (jar 3).  The examination was othewise normal.  Retroflexed views revealed no abnormalities. The time to cecum = 7.7 Withdrawal time = 17.8   The scope was withdrawn and the procedure completed. COMPLICATIONS: There were no immediate complications.  ENDOSCOPIC  IMPRESSION: There were numerous left sided diverticulum.  One pedunculated 1cm polyp in proximal rectum was removed with snare/cautery (jar 2). There were numerous flat rectosigmoid polyps (15-20) ranging in size from 56mm to 30mm.  One was removed with piecemeal snare/cautery (jar 1).  The rest seemed more hyperplastic appearing and were sampled with biopsy (jar 3).  The examination was othewise normal.  RECOMMENDATIONS: Await final pathology.  Please resume daily fiber supplement.  You can restart your plavix today.  eSigned:  Milus Banister, MD 12/13/2014 11:05 AM     PATIENT NAME:  Kyle Ballard, Kyle Ballard MR#: [id number]

## 2014-12-13 NOTE — Progress Notes (Signed)
Transferred to recovery room. A/O x3, pleased with MAC.  VSS.  Report to Karen, RN. 

## 2014-12-13 NOTE — Patient Instructions (Signed)
  RESTART  PLAVIX TODAY.  RESUME YOUR FIBER SUPPLEMENT.    YOU HAD AN ENDOSCOPIC PROCEDURE TODAY AT [facility]:   Refer to the procedure report that was given to you for any specific questions about what was found during the examination.  If the procedure report does not answer your questions, please call your gastroenterologist to clarify.  If you requested that your care partner not be given the details of your procedure findings, then the procedure report has been included in a sealed envelope for you to review at your convenience later.  YOU SHOULD EXPECT: Some feelings of bloating in the abdomen. Passage of more gas than usual.  Walking can help get rid of the air that was put into your GI tract during the procedure and reduce the bloating. If you had a lower endoscopy (such as a colonoscopy or flexible sigmoidoscopy) you may notice spotting of blood in your stool or on the toilet paper. If you underwent a bowel prep for your procedure, you may not have a normal bowel movement for a few days.  Please Note:  You might notice some irritation and congestion in your nose or some drainage.  This is from the oxygen used during your procedure.  There is no need for concern and it should clear up in a day or so.  SYMPTOMS TO REPORT IMMEDIATELY:   Following lower endoscopy (colonoscopy or flexible sigmoidoscopy):  Excessive amounts of blood in the stool  Significant tenderness or worsening of abdominal pains  Swelling of the abdomen that is new, acute  Fever of 100F or higher For urgent or emergent issues, a gastroenterologist can be reached at any hour by calling [phone].   DIET: Your first meal following the procedure should be a small meal and then it is ok to progress to your normal diet. Heavy or fried foods are harder to digest and may make you feel nauseous or bloated.  Likewise, meals heavy in dairy and vegetables can increase bloating.  Drink plenty of fluids but  you should avoid alcoholic beverages for 24 hours.  ACTIVITY:  You should plan to take it easy for the rest of today and you should NOT DRIVE or use heavy machinery until tomorrow (because of the sedation medicines used during the test).    FOLLOW UP: Our staff will call the number listed on your records the next business day following your procedure to check on you and address any questions or concerns that you may have regarding the information given to you following your procedure. If we do not reach you, we will leave a message.  However, if you are feeling well and you are not experiencing any problems, there is no need to return our call.  We will assume that you have returned to your regular daily activities without incident.  If any biopsies were taken you will be contacted by phone or by letter within the next 1-3 weeks.  Please call us at [phone] if you have not heard about the biopsies in 3 weeks.    SIGNATURES/CONFIDENTIALITY: You and/or your care partner have signed paperwork which will be entered into your electronic medical record.  These signatures attest to the fact that that the information above on your After Visit Summary has been reviewed and is understood.  Full responsibility of the confidentiality of this discharge information lies with you and/or your care-partner.

## 2014-12-14 ENCOUNTER — Telehealth: Payer: Self-pay | Admitting: *Deleted

## 2014-12-14 NOTE — Telephone Encounter (Signed)
  Follow up Call-no answer, left message to call if questions or concerns.     

## 2014-12-15 ENCOUNTER — Other Ambulatory Visit: Payer: Self-pay | Admitting: Family Medicine

## 2014-12-16 ENCOUNTER — Telehealth: Payer: Self-pay | Admitting: Gastroenterology

## 2014-12-16 NOTE — Telephone Encounter (Signed)
Passed a small amount of bright red blood this am.  Denies abdominal pain.  Had colonoscopy 3 days ago where a pedunculated polyp was removed.  Resumed plavix.  Pt was advised to stop plavix.  If no further bleeding may resume in 3 days.  He was carefully instructed to go to the ED if he should begin to pass BRBPR or clots.

## 2014-12-29 ENCOUNTER — Other Ambulatory Visit: Payer: Self-pay | Admitting: Family Medicine

## 2015-01-26 ENCOUNTER — Other Ambulatory Visit: Payer: Self-pay | Admitting: Family Medicine

## 2015-01-29 ENCOUNTER — Other Ambulatory Visit: Payer: Self-pay | Admitting: Family Medicine

## 2015-01-31 ENCOUNTER — Other Ambulatory Visit: Payer: Self-pay | Admitting: Family Medicine

## 2015-02-01 ENCOUNTER — Emergency Department (INDEPENDENT_AMBULATORY_CARE_PROVIDER_SITE_OTHER)
Admission: EM | Admit: 2015-02-01 | Discharge: 2015-02-01 | Disposition: A | Discharge status: Home / Self Care | Payer: BC Managed Care – PPO | Source: Home / Self Care | Attending: Family Medicine | Admitting: Family Medicine

## 2015-02-01 ENCOUNTER — Encounter (HOSPITAL_COMMUNITY): Payer: Self-pay | Admitting: Family Medicine

## 2015-02-01 DIAGNOSIS — R590 Localized enlarged lymph nodes: Secondary | ICD-10-CM | POA: Diagnosis not present

## 2015-02-01 DIAGNOSIS — H6091 Unspecified otitis externa, right ear: Secondary | ICD-10-CM

## 2015-02-01 MED ORDER — NEOMYCIN-POLYMYXIN-HC 3.5-10000-1 OT SOLN: OTIC | Status: DC

## 2015-02-01 MED ORDER — FLUTICASONE PROPIONATE 50 MCG/ACT NA SUSP: 2.0000 | Freq: Every day | NASAL | Status: DC

## 2015-02-01 MED ORDER — AMOXICILLIN 500 MG PO CAPS: 500.0000 mg | ORAL_CAPSULE | Freq: Two times a day (BID) | ORAL | Status: DC

## 2015-02-01 NOTE — Discharge Instructions (Signed)
The cause of your symptoms is not immediately clear but is likely due to an external ear infection.  Please start the antibiotic drops as prescribed, ibuprofen 600 mg every 6 hours, Zyrtec, Flonase before bed. If your symptoms continue to worsen please consider starting antibiotics as he may also have an underlying salivary gland infection. If your breathing become significantly worse you will need to follow-up in the emergency room as he may have another complication such as worsening heart failure.

## 2015-02-01 NOTE — ED Notes (Signed)
Pt has been suffering from right sided ear pain with some throat swelling on that side as well.

## 2015-02-01 NOTE — ED Provider Notes (Signed)
CSN: [id number]     Arrival date & time 02/01/15  1710 History   First MD Initiated Contact with Patient 02/01/15 1806     Chief Complaint  Patient presents with  . Otalgia  . Oral Swelling   (Consider location/radiation/quality/duration/timing/severity/associated sxs/prior Treatment) HPI  2 days ago developed earache. Constant. No loss of hearing or dizziness. Improved 1 day ago but again worsened today. Associated w/ R sided throat fulolness. Denies sore throat, fevers, dysphagia. Occasional postnasal drip and phlegm. Has not tried any medications for her problems.  Past Medical History  Diagnosis Date  . Hypertension   . CHF (congestive heart failure)   . Diabetes mellitus without complication   . Obesity (BMI 30-39.9)   . HLD (hyperlipidemia)   . Anal fissure    Past Surgical History  Procedure Laterality Date  . Tumor removal Right 1970    right anterior neck  . Tonsillectomy    . Left heart catheterization with coronary angiogram N/A 02/09/2014    Procedure: LEFT HEART CATHETERIZATION WITH CORONARY ANGIOGRAM;  Surgeon: Wellington Hampshire, MD;  Location: [facility];  Service: Cardiovascular;  Laterality: N/A;   Family History  Problem Relation Age of Onset  . Heart disease Mother   . Heart failure Mother     CHF  . Hypertension Brother   . Colon cancer Neg Hx    Social History  Substance Use Topics  . Smoking status: Current Some Day Smoker -- 0.25 packs/day    Types: Cigarettes  . Smokeless tobacco: Never Used  . Alcohol Use: No    Review of Systems Per HPI with all other pertinent systems negative.   Allergies  Lisinopril and Benadryl  Home Medications   Prior to Admission medications   Medication Sig Start Date End Date Taking? Authorizing Provider  amoxicillin (AMOXIL) 500 MG capsule Take 1 capsule (500 mg total) by mouth 2 (two) times daily. 02/01/15   Waldemar Dickens, MD  aspirin EC 81 MG EC tablet Take 1 tablet (81 mg total) by mouth daily. 05/15/12    Carolin Guernsey, MD  atorvastatin (LIPITOR) 40 MG tablet Take 40 mg by mouth daily.    Historical Provider, MD  atorvastatin (LIPITOR) 40 MG tablet TAKE ONE TABLET BY MOUTH [facility] EVENING AT 6PM 12/30/14   Rosemarie Ax, MD  carvedilol (COREG) 3.125 MG tablet TAKE ONE TABLET BY MOUTH TWICE DAILY WITH A MEAL 11/23/14   Rosemarie Ax, MD  clopidogrel (PLAVIX) 75 MG tablet TAKE ONE TABLET BY MOUTH ONCE DAILY 09/14/14   Wellington Hampshire, MD  fluticasone (FLONASE) 50 MCG/ACT nasal spray Place 2 sprays into both nostrils at bedtime. 02/01/15   Waldemar Dickens, MD  furosemide (LASIX) 20 MG tablet TAKE ONE TABLET BY MOUTH TWICE DAILY 01/30/15   Mariel Aloe, MD  metFORMIN (GLUCOPHAGE) 500 MG tablet TAKE ONE TABLET BY MOUTH TWICE DAILY WITH A MEAL 01/26/15   Rosemarie Ax, MD  neomycin-polymyxin-hydrocortisone (CORTISPORIN) otic solution 3-4 Drops, 4 times per day for 7 days 02/01/15   Waldemar Dickens, MD  NITROSTAT 0.4 MG SL tablet DISSOLVE ONE TABLET UNDER THE TONGUE EVERY 5 MINUTES AS NEEDED FOR CHEST PAIN. 11/15/14   Rosemarie Ax, MD  polyethylene glycol powder (GLYCOLAX/MIRALAX) powder Take 17 g by mouth 2 (two) times daily as needed. 07/21/14   Renee A Kuneff, DO   Meds Ordered and Administered this Visit  Medications - No data to display  BP 157/100 mmHg  Pulse 51  Temp(Src) 98 F (36.7 C) (Oral)  Resp 16  SpO2 97% No data found.   Physical Exam Physical Exam  Constitutional: oriented to person, place, and time. appears well-developed and well-nourished. No distress.  HENT:  Bilateral TMs normal, right external canal with significant cerumen which was removed using soft curette revealing underlying skin maceration and erythema from the 2 to 6:00 position. Right fullness at the submandibular angle of the jaw though little posteriorly. Difficult to ascertain whether fullness is from a swollen so mandibular gland or lymph node. Head: Normocephalic and atraumatic.  Eyes: EOMI. PERRL.   Neck: Normal range of motion.  Cardiovascular: RRR, no m/r/g, 2+ distal pulses,  Pulmonary/Chest: Effort normal and breath sounds normal. No respiratory distress.  Abdominal: Soft. Bowel sounds are normal. NonTTP, no distension.  Musculoskeletal: Normal range of motion. Non ttp, no effusion.  Neurological: alert and oriented to person, place, and time.  Skin: Skin is warm. No rash noted. non diaphoretic.  Psychiatric: normal mood and affect. behavior is normal. Judgment and thought content normal.   ED Course  Procedures (including critical care time)  Labs Review Labs Reviewed - No data to display  Imaging Review No results found.   Visual Acuity Review  Right Eye Distance:   Left Eye Distance:   Bilateral Distance:    Right Eye Near:   Left Eye Near:    Bilateral Near:         MDM   1. Otitis externa, right   2. Cervical lymphadenopathy    Cortisporin, Flonase, ibuprofen, Zyrtec. If symptoms do not improve patient start amoxicillin for possible submandibular gland inflammation and infection. Patient also to start sour hard candies to promote salivation.   Hypertension: Slightly elevated today. Has not taken his blood pressure medications today.  Waldemar Dickens, MD 02/01/15 [phone]

## 2015-02-05 ENCOUNTER — Other Ambulatory Visit: Payer: Self-pay | Admitting: Cardiovascular Disease

## 2015-02-06 ENCOUNTER — Other Ambulatory Visit: Payer: Self-pay | Admitting: Cardiovascular Disease

## 2015-02-13 ENCOUNTER — Other Ambulatory Visit: Payer: Self-pay | Admitting: Family Medicine

## 2015-02-25 ENCOUNTER — Other Ambulatory Visit: Payer: Self-pay | Admitting: Family Medicine

## 2015-03-06 ENCOUNTER — Encounter: Payer: Self-pay | Admitting: Family Medicine

## 2015-03-06 ENCOUNTER — Ambulatory Visit (INDEPENDENT_AMBULATORY_CARE_PROVIDER_SITE_OTHER): Payer: BC Managed Care – PPO | Admitting: Family Medicine

## 2015-03-06 VITALS — BP 138/102 | HR 99 | Temp 97.7°F | Ht 69.0 in | Wt 241.3 lb

## 2015-03-06 DIAGNOSIS — Z1159 Encounter for screening for other viral diseases: Secondary | ICD-10-CM

## 2015-03-06 DIAGNOSIS — Z114 Encounter for screening for human immunodeficiency virus [HIV]: Secondary | ICD-10-CM

## 2015-03-06 DIAGNOSIS — Z720 Tobacco use: Secondary | ICD-10-CM

## 2015-03-06 DIAGNOSIS — E1169 Type 2 diabetes mellitus with other specified complication: Secondary | ICD-10-CM

## 2015-03-06 DIAGNOSIS — Z23 Encounter for immunization: Secondary | ICD-10-CM | POA: Diagnosis not present

## 2015-03-06 DIAGNOSIS — E119 Type 2 diabetes mellitus without complications: Secondary | ICD-10-CM | POA: Diagnosis not present

## 2015-03-06 DIAGNOSIS — I10 Essential (primary) hypertension: Secondary | ICD-10-CM | POA: Diagnosis not present

## 2015-03-06 DIAGNOSIS — E785 Hyperlipidemia, unspecified: Secondary | ICD-10-CM | POA: Diagnosis not present

## 2015-03-06 LAB — CBC
HCT: 40 % (ref 39.0–52.0)
HEMOGLOBIN: 13.8 g/dL (ref 13.0–17.0)
MCH: 29.7 pg (ref 26.0–34.0)
MCHC: 34.5 g/dL (ref 30.0–36.0)
MCV: 86 fL (ref 78.0–100.0)
MPV: 8.5 fL — ABNORMAL LOW (ref 8.6–12.4)
PLATELETS: 298 10*3/uL (ref 150–400)
RBC: 4.65 MIL/uL (ref 4.22–5.81)
RDW: 13.7 % (ref 11.5–15.5)
WBC: 6.9 10*3/uL (ref 4.0–10.5)

## 2015-03-06 LAB — POCT GLYCOSYLATED HEMOGLOBIN (HGB A1C): HEMOGLOBIN A1C: 6.5

## 2015-03-06 LAB — TSH: TSH: 0.97 u[IU]/mL (ref 0.350–4.500)

## 2015-03-06 NOTE — Progress Notes (Signed)
Subjective:    Kyle Ballard - 64 y.o. male MRN [id number]  Date of birth: 1950/12/13  HPI  Kyle Ballard is here for multiple issues.  Recently retired last year.   CHRONIC DIABETES  Disease Monitoring  Blood Sugar Ranges: low 100's   Polyuria: no   Visual problems: no   Medication Compliance: yes  Medication Side Effects  Hypoglycemia: no   Preventitive Health Care  Eye Exam: 1 year ago   Foot Exam: today   Diet pattern: improved since last time.   Exercise: limited. Walks to corner. Roughly about a mile   HTN Disease Monitoring: Home BP Monitoring none  Medications:coreg  Chest pain- no     Dyspnea- no Compliance-  yes.  Lightheadedness-  no   Edema- no   HYPERLIPIDEMIA Symptoms Chest pain on exertion:  no   . Leg claudication:   no Medications: Lipitor  Compliance- yes  Right upper quadrant pain- no   Muscle aches- no     Component Value Date/Time   CHOL 172 03/06/2015 1605   TRIG 423* 03/06/2015 1605   HDL 24* 03/06/2015 1605   VLDL NOT CALC 03/06/2015 1605   CHOLHDL 7.2* 03/06/2015 1605     Tobacco abuse:  Has stopped smoking for about 1.5 years after he was dx with HF  He started again due to increase of stressors  1PPD will last him a week  Reports he can stop at any time he wants   Health Maintenance:  Health Maintenance Due  Topic Date Due  . Hepatitis C Screening  09-Mar-1951  . ZOSTAVAX  06/25/2011  . URINE MICROALBUMIN  11/18/2013  . FOOT EXAM  08/20/2014  . OPHTHALMOLOGY EXAM  09/21/2014    -  reports that he has been smoking Cigarettes.  He has been smoking about 0.25 packs per day. He has never used smokeless tobacco. - Review of Systems: Per HPI. - Past Medical History: Patient Active Problem List   Diagnosis Date Noted  . Tobacco abuse 03/07/2015  . Constipation 07/21/2014  . Anal fissure 07/21/2014  . Health care maintenance 07/21/2014  . Chest pain on exertion 02/07/2014  . Hyperlipidemia associated with type 2  diabetes mellitus ([place]) 11/17/2013  . Blurred vision, left eye 08/31/2013  . Acute combined systolic and diastolic CHF, NYHA class 1 ([place]) 06/11/2012  . Obesity (BMI 30-39.9) 05/18/2012  . Diabetes mellitus, type 2 ([place]) 05/18/2012  . Hypertension 05/18/2012   - Medications: reviewed and updated Current Outpatient Prescriptions  Medication Sig Dispense Refill  . aspirin EC 81 MG EC tablet Take 1 tablet (81 mg total) by mouth daily. 30 tablet 0  . atorvastatin (LIPITOR) 40 MG tablet Take 40 mg by mouth daily.    . carvedilol (COREG) 3.125 MG tablet TAKE ONE TABLET BY MOUTH TWICE DAILY WITH A MEAL 90 tablet 2  . clopidogrel (PLAVIX) 75 MG tablet TAKE ONE TABLET BY MOUTH ONCE DAILY 30 tablet 0  . fluticasone (FLONASE) 50 MCG/ACT nasal spray Place 2 sprays into both nostrils at bedtime. 16 g 0  . furosemide (LASIX) 20 MG tablet TAKE ONE TABLET BY MOUTH TWICE DAILY 30 tablet 0  . metFORMIN (GLUCOPHAGE) 500 MG tablet TAKE ONE TABLET BY MOUTH TWICE DAILY WITH A MEAL 60 tablet 3  . NITROSTAT 0.4 MG SL tablet DISSOLVE ONE TABLET UNDER THE TONGUE EVERY 5 MINUTES AS NEEDED FOR CHEST PAIN. 25 tablet 0  . polyethylene glycol powder (GLYCOLAX/MIRALAX) powder Take 17 g by mouth 2 (two) times  daily as needed. 3350 g 1   No current facility-administered medications for this visit.     Review of Systems See HPI     Objective:   Physical Exam BP 138/102 mmHg  Pulse 99  Temp(Src) 97.7 F (36.5 C) (Oral)  Ht 5\' 9"  (1.753 m)  Wt 241 lb 4.8 oz (109.453 kg)  BMI 35.62 kg/m2 Gen: NAD, alert, cooperative with exam, well-appearing CV: RRR, good S1/S2, no murmur, no edema, capillary refill brisk  Resp: CTABL, no wheezes, non-labored Skin: no rashes, normal turgor  Neuro: no gross deficits.  Psych: alert and oriented      Assessment & Plan:   Hypertension Uncontrolled Continue current medications  Hx of lisinopril of swelling (angioedema). May try ARB in future. Unsure if he has tried  -  advised to f/u in nurse visit  - need to determine allergy to ACEi and try to start ARB  Diabetes mellitus, type 2 Controlled  - continue current regimen   Hyperlipidemia associated with type 2 diabetes mellitus ([place]) Continue statin  - lipid panel   Tobacco abuse Has started smoking again.  He reports it is secondary to stress from his neighbors  - he reports he can stop at any time  - counseled today about quitting  - encouraged to f/u soon in order to determine if he would like to try any stopping techniques.

## 2015-03-06 NOTE — Patient Instructions (Signed)
Thank you for coming in,   I will call you or send a letter with the results from today.  Please try to quit smoking and please let me know if there is anything I can do to help you.  Please bring all of your medications with you to each visit.   Sign up for My Chart to have easy access to your labs results, and communication with your Primary care physician   Please feel free to call with any questions or concerns at any time, at [phone]. --Dr. Raeford Razor  Please think about quitting smoking.  This is very important for your health.  There are many things we can do to help you quit.  Let  us know if you are interested.  You can also call 1-800-QUIT-NOW [phone]) for free smoking cessation counseling.

## 2015-03-07 DIAGNOSIS — Z720 Tobacco use: Secondary | ICD-10-CM | POA: Insufficient documentation

## 2015-03-07 LAB — HIV ANTIBODY (ROUTINE TESTING W REFLEX): HIV 1&2 Ab, 4th Generation: NONREACTIVE

## 2015-03-07 LAB — COMPREHENSIVE METABOLIC PANEL
ALBUMIN: 4.4 g/dL (ref 3.6–5.1)
ALT: 17 U/L (ref 9–46)
AST: 16 U/L (ref 10–35)
Alkaline Phosphatase: 67 U/L (ref 40–115)
BUN: 13 mg/dL (ref 7–25)
CHLORIDE: 103 mmol/L (ref 98–110)
CO2: 26 mmol/L (ref 20–31)
CREATININE: 0.89 mg/dL (ref 0.70–1.25)
Calcium: 9.7 mg/dL (ref 8.6–10.3)
Glucose, Bld: 101 mg/dL — ABNORMAL HIGH (ref 65–99)
Potassium: 4.5 mmol/L (ref 3.5–5.3)
SODIUM: 139 mmol/L (ref 135–146)
TOTAL PROTEIN: 7.6 g/dL (ref 6.1–8.1)
Total Bilirubin: 0.5 mg/dL (ref 0.2–1.2)

## 2015-03-07 LAB — LIPID PANEL
CHOL/HDL RATIO: 7.2 ratio — AB (ref <=5.0)
Cholesterol: 172 mg/dL (ref 125–200)
HDL: 24 mg/dL — AB (ref >=40)
TRIGLYCERIDES: 423 mg/dL — AB (ref <150)

## 2015-03-07 LAB — HEPATITIS C ANTIBODY: HCV Ab: NEGATIVE

## 2015-03-07 NOTE — Assessment & Plan Note (Signed)
Uncontrolled Continue current medications  Hx of lisinopril of swelling (angioedema). May try ARB in future. Unsure if he has tried  - advised to f/u in nurse visit  - need to determine allergy to ACEi and try to start ARB

## 2015-03-07 NOTE — Assessment & Plan Note (Signed)
Has started smoking again.  He reports it is secondary to stress from his neighbors  - he reports he can stop at any time  - counseled today about quitting  - encouraged to f/u soon in order to determine if he would like to try any stopping techniques.

## 2015-03-07 NOTE — Assessment & Plan Note (Signed)
Continue statin  - lipid panel

## 2015-03-07 NOTE — Assessment & Plan Note (Signed)
Controlled- continue current regimen.  

## 2015-03-10 ENCOUNTER — Encounter: Payer: Self-pay | Admitting: Family Medicine

## 2015-03-20 ENCOUNTER — Telehealth: Payer: Self-pay | Admitting: Family Medicine

## 2015-03-20 NOTE — Telephone Encounter (Signed)
Wife calls inquiring about FMLA forms for patient. Please advise.

## 2015-03-22 NOTE — Telephone Encounter (Signed)
Wife calling once more about this request. She states that [employer] should have faxed them to Dr. Raeford Razor. They would like to know when it will be completed. Thank you, Fonda Kinder, ASA

## 2015-03-24 NOTE — Telephone Encounter (Signed)
Pt informed of PCP comment of not having received FMLA paper and if they need them filled out they will need to schedule an appointment. Katharina Caper, April D, [place] '

## 2015-03-29 ENCOUNTER — Telehealth: Payer: Self-pay | Admitting: Family Medicine

## 2015-03-29 NOTE — Telephone Encounter (Signed)
Wife called asking about FMLA paperwork. She doesn't think an appt is needed because he was seen Oct 31.  Wants to know if the paperwork from [employer] has been received

## 2015-03-29 NOTE — Telephone Encounter (Signed)
Spoke with wife and she states that she never had this trouble with FMLA forms before.  She spoke with Rudi Rummage and had them faxed to her attention.  Will check with Asha to see if she ever received forms.  Jazmin Hartsell,CMA

## 2015-03-29 NOTE — Telephone Encounter (Signed)
Tried calling, line busy x 2. No forms ever came through for this patient yesterday nor today. Wife should contact [employer] and make sure they are sending it to the correct fax number and dialing the area code first.

## 2015-04-03 NOTE — Telephone Encounter (Signed)
Wife brought in [place] papers to be completed. Her employer had been faxing them but they were in her name not her husbands Please notifiy her when they are ready for pickup

## 2015-04-04 NOTE — Telephone Encounter (Signed)
Forms placed in PCP box to be completed. Kyle Ballard, Kyle Ballard, Kyle Ballard

## 2015-04-06 NOTE — Telephone Encounter (Signed)
fmla completed   Rosemarie Ax, MD PGY-3, [facility] 04/06/2015, 10:28 AM

## 2015-04-07 NOTE — Telephone Encounter (Signed)
Pt informed by Kennyth Lose. Jonita Hirota, Salome Spotted

## 2015-04-07 NOTE — Telephone Encounter (Signed)
[name] for pt to call back.  Kyle Ballard  

## 2015-04-10 ENCOUNTER — Other Ambulatory Visit: Payer: Self-pay | Admitting: Family Medicine

## 2015-05-22 ENCOUNTER — Ambulatory Visit (INDEPENDENT_AMBULATORY_CARE_PROVIDER_SITE_OTHER): Payer: BC Managed Care – PPO | Admitting: Adult Health

## 2015-05-22 ENCOUNTER — Encounter: Payer: Self-pay | Admitting: Adult Health

## 2015-05-22 VITALS — BP 130/100 | Temp 97.7°F | Ht 69.0 in | Wt 248.0 lb

## 2015-05-22 DIAGNOSIS — N50811 Right testicular pain: Secondary | ICD-10-CM

## 2015-05-22 DIAGNOSIS — I10 Essential (primary) hypertension: Secondary | ICD-10-CM | POA: Diagnosis not present

## 2015-05-22 DIAGNOSIS — E119 Type 2 diabetes mellitus without complications: Secondary | ICD-10-CM

## 2015-05-22 DIAGNOSIS — Z7189 Other specified counseling: Secondary | ICD-10-CM

## 2015-05-22 DIAGNOSIS — Z7689 Persons encountering health services in other specified circumstances: Secondary | ICD-10-CM

## 2015-05-22 LAB — POCT URINALYSIS DIPSTICK
Bilirubin, UA: NEGATIVE
GLUCOSE UA: NEGATIVE
KETONES UA: NEGATIVE
Leukocytes, UA: NEGATIVE
Nitrite, UA: NEGATIVE
Protein, UA: NEGATIVE
SPEC GRAV UA: 1.01
Urobilinogen, UA: 0.2
pH, UA: 5
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
## 2015-05-22 NOTE — Patient Instructions (Signed)
It was great meeting you today!  Someone will call you to schedule the ultrasound  Follow up with me in March for a complete physical. If you need anything in the meantime, please let me know.

## 2015-05-22 NOTE — Progress Notes (Signed)
Pre visit review using our clinic review tool, if applicable. No additional management support is needed unless otherwise documented below in the visit note. 

## 2015-05-23 LAB — URINALYSIS, MICROSCOPIC ONLY
RBC / HPF: NONE SEEN (ref 0–?)
WBC UA: NONE SEEN (ref 0–?)

## 2015-05-23 NOTE — Addendum Note (Signed)
Addended by: Colleen Can on: 05/23/2015 09:10 AM   Modules accepted: Orders

## 2015-05-28 ENCOUNTER — Other Ambulatory Visit: Payer: Self-pay | Admitting: Cardiovascular Disease

## 2015-05-28 ENCOUNTER — Other Ambulatory Visit: Payer: Self-pay | Admitting: Family Medicine

## 2015-06-06 ENCOUNTER — Telehealth: Payer: Self-pay | Admitting: Adult Health

## 2015-06-06 MED ORDER — CLOPIDOGREL BISULFATE 75 MG PO TABS: 75.0000 mg | ORAL_TABLET | Freq: Every day | ORAL | Status: DC

## 2015-06-06 NOTE — Telephone Encounter (Signed)
Patient would like his Plavix medication refilled and sent to the [facility] on [address].  He has run out 3 days ago.

## 2015-06-06 NOTE — Telephone Encounter (Signed)
Rx sent to pharmacy for 90 day supply.  Pt will come in for physical exam in March.

## 2015-06-18 ENCOUNTER — Other Ambulatory Visit: Payer: Self-pay | Admitting: Family Medicine

## 2015-06-26 ENCOUNTER — Ambulatory Visit
Admission: RE | Admit: 2015-06-26 | Discharge: 2015-06-26 | Disposition: A | Discharge status: Home / Self Care | Payer: BC Managed Care – PPO | Source: Ambulatory Visit | Attending: Adult Health | Admitting: Adult Health

## 2015-06-26 DIAGNOSIS — N50811 Right testicular pain: Secondary | ICD-10-CM

## 2015-06-27 ENCOUNTER — Other Ambulatory Visit: Payer: Self-pay | Admitting: Family Medicine

## 2015-06-28 ENCOUNTER — Other Ambulatory Visit: Payer: Self-pay | Admitting: *Deleted

## 2015-06-28 ENCOUNTER — Other Ambulatory Visit: Payer: Self-pay

## 2015-06-28 MED ORDER — FUROSEMIDE 20 MG PO TABS: 20.0000 mg | ORAL_TABLET | Freq: Two times a day (BID) | ORAL | Status: DC

## 2015-06-28 NOTE — Telephone Encounter (Signed)
Opened in error. Zimmerman Rumple, Kaydn Kumpf D, CMA  

## 2015-06-28 NOTE — Telephone Encounter (Signed)
Pt has requested refill on Furosemide - ok to refill?

## 2015-06-29 MED ORDER — FUROSEMIDE 20 MG PO TABS: 20.0000 mg | ORAL_TABLET | Freq: Two times a day (BID) | ORAL | Status: DC

## 2015-07-03 ENCOUNTER — Other Ambulatory Visit: Payer: Self-pay | Admitting: Family Medicine

## 2015-07-06 ENCOUNTER — Other Ambulatory Visit (INDEPENDENT_AMBULATORY_CARE_PROVIDER_SITE_OTHER): Payer: BC Managed Care – PPO

## 2015-07-06 DIAGNOSIS — Z0000 Encounter for general adult medical examination without abnormal findings: Secondary | ICD-10-CM | POA: Diagnosis not present

## 2015-07-06 DIAGNOSIS — R7989 Other specified abnormal findings of blood chemistry: Secondary | ICD-10-CM

## 2015-07-06 LAB — POC URINALSYSI DIPSTICK (AUTOMATED)
GLUCOSE UA: NEGATIVE
KETONES UA: NEGATIVE
LEUKOCYTES UA: NEGATIVE
Nitrite, UA: NEGATIVE
Urobilinogen, UA: 0.2
pH, UA: 5.5

## 2015-07-06 LAB — CBC WITH DIFFERENTIAL/PLATELET
BASOS ABS: 0 10*3/uL (ref 0.0–0.1)
Basophils Relative: 0.6 % (ref 0.0–3.0)
Eosinophils Absolute: 0.2 10*3/uL (ref 0.0–0.7)
Eosinophils Relative: 3.3 % (ref 0.0–5.0)
HCT: 40.4 % (ref 39.0–52.0)
HEMOGLOBIN: 13.8 g/dL (ref 13.0–17.0)
LYMPHS PCT: 33.5 % (ref 12.0–46.0)
Lymphs Abs: 1.7 10*3/uL (ref 0.7–4.0)
MCHC: 34 g/dL (ref 30.0–36.0)
MCV: 86.7 fl (ref 78.0–100.0)
MONOS PCT: 8.9 % (ref 3.0–12.0)
Monocytes Absolute: 0.5 10*3/uL (ref 0.1–1.0)
NEUTROS ABS: 2.7 10*3/uL (ref 1.4–7.7)
Neutrophils Relative %: 53.7 % (ref 43.0–77.0)
Platelets: 313 10*3/uL (ref 150.0–400.0)
RBC: 4.67 Mil/uL (ref 4.22–5.81)
RDW: 13.8 % (ref 11.5–15.5)
WBC: 5.1 10*3/uL (ref 4.0–10.5)

## 2015-07-06 LAB — HEPATIC FUNCTION PANEL
ALBUMIN: 4.4 g/dL (ref 3.5–5.2)
ALK PHOS: 70 U/L (ref 39–117)
ALT: 25 U/L (ref 0–53)
AST: 19 U/L (ref 0–37)
BILIRUBIN DIRECT: 0.1 mg/dL (ref 0.0–0.3)
TOTAL PROTEIN: 7.1 g/dL (ref 6.0–8.3)
Total Bilirubin: 0.4 mg/dL (ref 0.2–1.2)

## 2015-07-06 LAB — MICROALBUMIN / CREATININE URINE RATIO
Creatinine,U: 320.3 mg/dL
MICROALB/CREAT RATIO: 0.6 mg/g (ref 0.0–30.0)
Microalb, Ur: 2 mg/dL — ABNORMAL HIGH (ref 0.0–1.9)

## 2015-07-06 LAB — BASIC METABOLIC PANEL
BUN: 15 mg/dL (ref 6–23)
CALCIUM: 9.7 mg/dL (ref 8.4–10.5)
CO2: 29 meq/L (ref 19–32)
Chloride: 104 mEq/L (ref 96–112)
Creatinine, Ser: 0.96 mg/dL (ref 0.40–1.50)
GFR: 101.41 mL/min (ref >60.00)
Glucose, Bld: 138 mg/dL — ABNORMAL HIGH (ref 70–99)
Potassium: 4.1 mEq/L (ref 3.5–5.1)
SODIUM: 138 meq/L (ref 135–145)

## 2015-07-06 LAB — LIPID PANEL
CHOL/HDL RATIO: 6
Cholesterol: 119 mg/dL (ref 0–200)
HDL: 21.1 mg/dL — AB (ref >39.00)
NONHDL: 97.52
Triglycerides: 359 mg/dL — ABNORMAL HIGH (ref 0.0–149.0)
VLDL: 71.8 mg/dL — AB (ref 0.0–40.0)

## 2015-07-06 LAB — TSH: TSH: 1.1 u[IU]/mL (ref 0.35–4.50)

## 2015-07-06 LAB — HEMOGLOBIN A1C: HEMOGLOBIN A1C: 7.2 % — AB (ref 4.6–6.5)

## 2015-07-06 LAB — PSA: PSA: 0.32 ng/mL (ref 0.10–4.00)

## 2015-07-06 LAB — LDL CHOLESTEROL, DIRECT: LDL DIRECT: 45 mg/dL

## 2015-07-12 ENCOUNTER — Encounter: Payer: Self-pay | Admitting: Adult Health

## 2015-07-12 ENCOUNTER — Ambulatory Visit (INDEPENDENT_AMBULATORY_CARE_PROVIDER_SITE_OTHER): Payer: BC Managed Care – PPO | Admitting: Adult Health

## 2015-07-12 VITALS — BP 126/92 | Temp 97.9°F | Ht 69.0 in | Wt 249.5 lb

## 2015-07-12 DIAGNOSIS — E669 Obesity, unspecified: Secondary | ICD-10-CM

## 2015-07-12 DIAGNOSIS — E785 Hyperlipidemia, unspecified: Secondary | ICD-10-CM

## 2015-07-12 DIAGNOSIS — E1169 Type 2 diabetes mellitus with other specified complication: Secondary | ICD-10-CM | POA: Diagnosis not present

## 2015-07-12 DIAGNOSIS — Z720 Tobacco use: Secondary | ICD-10-CM

## 2015-07-12 DIAGNOSIS — Z0000 Encounter for general adult medical examination without abnormal findings: Secondary | ICD-10-CM

## 2015-07-12 DIAGNOSIS — I10 Essential (primary) hypertension: Secondary | ICD-10-CM | POA: Diagnosis not present

## 2015-07-12 DIAGNOSIS — R319 Hematuria, unspecified: Secondary | ICD-10-CM | POA: Diagnosis not present

## 2015-07-12 LAB — URINALYSIS, ROUTINE W REFLEX MICROSCOPIC
Bilirubin Urine: NEGATIVE
Hgb urine dipstick: NEGATIVE
KETONES UR: NEGATIVE
Leukocytes, UA: NEGATIVE
NITRITE: NEGATIVE
PH: 5.5 (ref 5.0–8.0)
RBC / HPF: NONE SEEN (ref 0–?)
SPECIFIC GRAVITY, URINE: 1.025 (ref 1.000–1.030)
Total Protein, Urine: NEGATIVE
URINE GLUCOSE: NEGATIVE
Urobilinogen, UA: 0.2 (ref 0.0–1.0)

## 2015-07-12 MED ORDER — EZETIMIBE 10 MG PO TABS: 10.0000 mg | ORAL_TABLET | Freq: Every day | ORAL | Status: DC
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
## 2015-07-12 NOTE — Progress Notes (Signed)
Pre visit review using our clinic review tool, if applicable. No additional management support is needed unless otherwise documented below in the visit note. 

## 2015-07-12 NOTE — Patient Instructions (Addendum)
It was great seeing you again.   As discussed work on diet and exercise.   I have sent in a prescription for Zetia. Take this in conjunction with your statin medication.  I will follow up with you regarding your lab work   Follow up with me in 6 months  QUIT SMOKING!!!  Smoking Cessation, Tips for Success If you are ready to quit smoking, congratulations! You have chosen to help yourself be healthier. Cigarettes bring nicotine, tar, carbon monoxide, and other irritants into your body. Your lungs, heart, and blood vessels will be able to work better without these poisons. There are many different ways to quit smoking. Nicotine gum, nicotine patches, a nicotine inhaler, or nicotine nasal spray can help with physical craving. Hypnosis, support groups, and medicines help break the habit of smoking. WHAT THINGS CAN I DO TO MAKE QUITTING EASIER?  Here are some tips to help you quit for good:  Pick a date when you will quit smoking completely. Tell all of your friends and family about your plan to quit on that date.  Do not try to slowly cut down on the number of cigarettes you are smoking. Pick a quit date and quit smoking completely starting on that day.  Throw away all cigarettes.   Clean and remove all ashtrays from your home, work, and car.  On a card, write down your reasons for quitting. Carry the card with you and read it when you get the urge to smoke.  Cleanse your body of nicotine. Drink enough water and fluids to keep your urine clear or pale yellow. Do this after quitting to flush the nicotine from your body.  Learn to predict your moods. Do not let a bad situation be your excuse to have a cigarette. Some situations in your life might tempt you into wanting a cigarette.  Never have "just one" cigarette. It leads to wanting another and another. Remind yourself of your decision to quit.  Change habits associated with smoking. If you smoked while driving or when feeling stressed,  try other activities to replace smoking. Stand up when drinking your coffee. Brush your teeth after eating. Sit in a different chair when you read the paper. Avoid alcohol while trying to quit, and try to drink fewer caffeinated beverages. Alcohol and caffeine may urge you to smoke.  Avoid foods and drinks that can trigger a desire to smoke, such as sugary or spicy foods and alcohol.  Ask people who smoke not to smoke around you.  Have something planned to do right after eating or having a cup of coffee. For example, plan to take a walk or exercise.  Try a relaxation exercise to calm you down and decrease your stress. Remember, you may be tense and nervous for the first 2 weeks after you quit, but this will pass.  Find new activities to keep your hands busy. Play with a pen, coin, or rubber band. Doodle or draw things on paper.  Brush your teeth right after eating. This will help cut down on the craving for the taste of tobacco after meals. You can also try mouthwash.   Use oral substitutes in place of cigarettes. Try using lemon drops, carrots, cinnamon sticks, or chewing gum. Keep them handy so they are available when you have the urge to smoke.  When you have the urge to smoke, try deep breathing.  Designate your home as a nonsmoking area.  If you are a heavy smoker, ask your health care  provider about a prescription for nicotine chewing gum. It can ease your withdrawal from nicotine.  Reward yourself. Set aside the cigarette money you save and buy yourself something nice.  Look for support from others. Join a support group or smoking cessation program. Ask someone at home or at work to help you with your plan to quit smoking.  Always ask yourself, "Do I need this cigarette or is this just a reflex?" Tell yourself, "Today, I choose not to smoke," or "I do not want to smoke." You are reminding yourself of your decision to quit.  Do not replace cigarette smoking with electronic  cigarettes (commonly called e-cigarettes). The safety of e-cigarettes is unknown, and some may contain harmful chemicals.  If you relapse, do not give up! Plan ahead and think about what you will do the next time you get the urge to smoke. HOW WILL I FEEL WHEN I QUIT SMOKING? You may have symptoms of withdrawal because your body is used to nicotine (the addictive substance in cigarettes). You may crave cigarettes, be irritable, feel very hungry, cough often, get headaches, or have difficulty concentrating. The withdrawal symptoms are only temporary. They are strongest when you first quit but will go away within 10-14 days. When withdrawal symptoms occur, stay in control. Think about your reasons for quitting. Remind yourself that these are signs that your body is healing and getting used to being without cigarettes. Remember that withdrawal symptoms are easier to treat than the major diseases that smoking can cause.  Even after the withdrawal is over, expect periodic urges to smoke. However, these cravings are generally short lived and will go away whether you smoke or not. Do not smoke! WHAT RESOURCES ARE AVAILABLE TO HELP ME QUIT SMOKING? Your health care provider can direct you to community resources or hospitals for support, which may include:  Group support.  Education.  Hypnosis.  Therapy.   This information is not intended to replace advice given to you by your health care provider. Make sure you discuss any questions you have with your health care provider.   Document Released: 01/19/2004 Document Revised: 05/13/2014 Document Reviewed: 10/08/2012 Elsevier Interactive Patient Education [employer].

## 2015-07-22 ENCOUNTER — Other Ambulatory Visit: Payer: Self-pay | Admitting: Family Medicine

## 2015-07-31 ENCOUNTER — Other Ambulatory Visit: Payer: Self-pay | Admitting: Family Medicine

## 2015-08-01 ENCOUNTER — Other Ambulatory Visit: Payer: Self-pay | Admitting: Family Medicine

## 2015-08-10 ENCOUNTER — Other Ambulatory Visit: Payer: Self-pay | Admitting: Family Medicine

## 2015-08-14 ENCOUNTER — Other Ambulatory Visit: Payer: Self-pay | Admitting: Family Medicine

## 2015-08-15 ENCOUNTER — Other Ambulatory Visit: Payer: Self-pay | Admitting: General Practice

## 2015-08-15 MED ORDER — METFORMIN HCL 500 MG PO TABS: 500.0000 mg | ORAL_TABLET | Freq: Two times a day (BID) | ORAL | Status: DC

## 2015-09-13 ENCOUNTER — Other Ambulatory Visit: Payer: Self-pay | Admitting: Family Medicine

## 2015-09-14 ENCOUNTER — Telehealth: Payer: Self-pay | Admitting: Adult Health

## 2015-09-14 ENCOUNTER — Other Ambulatory Visit: Payer: Self-pay | Admitting: Family Medicine

## 2015-09-14 ENCOUNTER — Other Ambulatory Visit: Payer: Self-pay

## 2015-09-14 MED ORDER — CARVEDILOL 3.125 MG PO TABS: 3.1250 mg | ORAL_TABLET | Freq: Two times a day (BID) | ORAL | Status: DC

## 2015-09-14 NOTE — Telephone Encounter (Signed)
Rx refilled.

## 2015-09-14 NOTE — Telephone Encounter (Signed)
Pt needs refill on carvedilol # 180 W/refills send to [facility]

## 2015-09-19 ENCOUNTER — Other Ambulatory Visit: Payer: Self-pay | Admitting: Adult Health

## 2015-10-30 ENCOUNTER — Telehealth: Payer: Self-pay | Admitting: Adult Health

## 2015-10-30 NOTE — Telephone Encounter (Signed)
Wife calling to inquire if Kyle Ballard has received pt's paperwork for FMLA. It is due to [employer] this week.

## 2015-10-30 NOTE — Telephone Encounter (Signed)
Papers had been given to Dr. [place]ique, but papers are now on Cory's desk. Called & spoke to the wife to let her know. She would like a call when the papers are completed.

## 2015-11-08 ENCOUNTER — Telehealth: Payer: Self-pay | Admitting: Adult Health

## 2015-11-08 NOTE — Telephone Encounter (Signed)
Wife got a notice pt's FMLA paperwork additional information. Have you seen? There is a time limit on getting it processed.

## 2015-11-09 ENCOUNTER — Telehealth: Payer: Self-pay

## 2015-11-09 NOTE — Telephone Encounter (Signed)
Error

## 2015-11-10 NOTE — Telephone Encounter (Signed)
Spoke with [employer] He states that he has faxed in FMLA form and has copy at his desk. I notified patient of this, and let her know that if she needs to come pick up she can.

## 2015-12-20 ENCOUNTER — Other Ambulatory Visit: Payer: Self-pay | Admitting: Adult Health

## 2016-01-24 ENCOUNTER — Other Ambulatory Visit: Payer: Self-pay | Admitting: Adult Health

## 2016-01-25 NOTE — Telephone Encounter (Signed)
He should follow up with Cardiology since he has not seen them since 02/2014

## 2016-01-26 NOTE — Telephone Encounter (Signed)
Patient notified

## 2016-01-30 ENCOUNTER — Encounter: Payer: Self-pay | Admitting: Cardiovascular Disease

## 2016-01-30 ENCOUNTER — Ambulatory Visit (INDEPENDENT_AMBULATORY_CARE_PROVIDER_SITE_OTHER): Payer: BC Managed Care – PPO | Admitting: Cardiovascular Disease

## 2016-01-30 VITALS — BP 150/120 | HR 62 | Ht 69.5 in | Wt 246.6 lb

## 2016-01-30 DIAGNOSIS — Z720 Tobacco use: Secondary | ICD-10-CM | POA: Diagnosis not present

## 2016-01-30 DIAGNOSIS — I2510 Atherosclerotic heart disease of native coronary artery without angina pectoris: Secondary | ICD-10-CM

## 2016-01-30 DIAGNOSIS — E1169 Type 2 diabetes mellitus with other specified complication: Secondary | ICD-10-CM | POA: Diagnosis not present

## 2016-01-30 DIAGNOSIS — I10 Essential (primary) hypertension: Secondary | ICD-10-CM

## 2016-01-30 DIAGNOSIS — E785 Hyperlipidemia, unspecified: Secondary | ICD-10-CM

## 2016-01-30 MED ORDER — FISH OIL 1000 MG PO CAPS: 3000.0000 mg | ORAL_CAPSULE | Freq: Every day | ORAL | 0 refills | Status: DC

## 2016-01-30 MED ORDER — CLOPIDOGREL BISULFATE 75 MG PO TABS: 75.0000 mg | ORAL_TABLET | Freq: Every day | ORAL | 3 refills | Status: DC

## 2016-01-30 MED ORDER — LOSARTAN POTASSIUM 50 MG PO TABS: 50.0000 mg | ORAL_TABLET | Freq: Every day | ORAL | 3 refills | Status: DC

## 2016-01-30 NOTE — Patient Instructions (Addendum)
Medication Instructions:  Your physician has recommended you make the following change in your medication:  1. STOP Zetia 2. START Losartan 50mg  take one tablet by mouth daily 3. START Over the counter Fish Oil 1000mg  take 3 capsules daily  Labwork: Your physician recommends that you return for lab work in: 1 WEEK (BMP)--order given to the patient, the will contact our office if he has this drawn by his PCP  Testing/Procedures: No new orders.   Follow-Up: Your physician wants you to follow-up in: 1 YEAR with Dr Fletcher Anon. You will receive a reminder letter in the mail two months in advance. If you don't receive a letter, please call our office to schedule the follow-up appointment.   Any Other Special Instructions Will Be Listed Below (If Applicable).     If you need a refill on your cardiac medications before your next appointment, please call your pharmacy.
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
## 2016-02-07 LAB — BASIC METABOLIC PANEL
BUN: 11 mg/dL (ref 7–25)
CHLORIDE: 104 mmol/L (ref 98–110)
CO2: 27 mmol/L (ref 20–31)
Calcium: 9.6 mg/dL (ref 8.6–10.3)
Creat: 0.88 mg/dL (ref 0.70–1.25)
GLUCOSE: 130 mg/dL — AB (ref 65–99)
POTASSIUM: 4.5 mmol/L (ref 3.5–5.3)
SODIUM: 139 mmol/L (ref 135–146)

## 2016-02-16 ENCOUNTER — Encounter: Payer: Self-pay | Admitting: Internal Medicine

## 2016-02-16 ENCOUNTER — Ambulatory Visit (INDEPENDENT_AMBULATORY_CARE_PROVIDER_SITE_OTHER): Payer: BC Managed Care – PPO | Admitting: Internal Medicine

## 2016-02-16 VITALS — BP 144/88 | HR 70 | Temp 98.2°F | Wt 246.2 lb

## 2016-02-16 DIAGNOSIS — E119 Type 2 diabetes mellitus without complications: Secondary | ICD-10-CM | POA: Diagnosis not present

## 2016-02-16 DIAGNOSIS — J018 Other acute sinusitis: Secondary | ICD-10-CM | POA: Diagnosis not present

## 2016-02-16 DIAGNOSIS — J069 Acute upper respiratory infection, unspecified: Secondary | ICD-10-CM

## 2016-02-16 MED ORDER — AMOXICILLIN-POT CLAVULANATE 875-125 MG PO TABS: 1.0000 | ORAL_TABLET | Freq: Two times a day (BID) | ORAL | 0 refills | Status: DC
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
## 2016-02-16 NOTE — Patient Instructions (Addendum)
This still may be a viral sinusitis    And chest cold but since relapsing   More concern if gets worse for a bacterial infection . If over the next few days getting worse instead of better .   Add the antibiotic .   For possible sinus infection .   Advise get a flu vaccine   For reasons   Discussed .

## 2016-05-11 ENCOUNTER — Other Ambulatory Visit: Payer: Self-pay | Admitting: Adult Health

## 2016-06-18 ENCOUNTER — Ambulatory Visit (INDEPENDENT_AMBULATORY_CARE_PROVIDER_SITE_OTHER): Payer: Medicare Other | Admitting: Adult Health

## 2016-06-18 ENCOUNTER — Encounter: Payer: Self-pay | Admitting: Adult Health

## 2016-06-18 VITALS — BP 148/72 | Ht 69.5 in | Wt 244.2 lb

## 2016-06-18 DIAGNOSIS — Z23 Encounter for immunization: Secondary | ICD-10-CM | POA: Diagnosis not present

## 2016-06-18 DIAGNOSIS — E119 Type 2 diabetes mellitus without complications: Secondary | ICD-10-CM | POA: Diagnosis not present

## 2016-06-18 DIAGNOSIS — I10 Essential (primary) hypertension: Secondary | ICD-10-CM | POA: Diagnosis not present

## 2016-06-18 LAB — POCT GLYCOSYLATED HEMOGLOBIN (HGB A1C): HEMOGLOBIN A1C: 6.7

## 2016-06-18 MED ORDER — LOSARTAN POTASSIUM 100 MG PO TABS: 100.0000 mg | ORAL_TABLET | Freq: Every day | ORAL | 1 refills | Status: DC
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
## 2016-06-30 ENCOUNTER — Other Ambulatory Visit: Payer: Self-pay | Admitting: Adult Health

## 2016-07-14 ENCOUNTER — Emergency Department (HOSPITAL_COMMUNITY)
Admission: EM | Admit: 2016-07-14 | Discharge: 2016-07-14 | Disposition: A | Discharge status: Home / Self Care | Payer: Medicare Other | Attending: Emergency Medicine | Admitting: Emergency Medicine

## 2016-07-14 ENCOUNTER — Encounter (HOSPITAL_COMMUNITY): Payer: Self-pay | Admitting: Emergency Medicine

## 2016-07-14 ENCOUNTER — Emergency Department (HOSPITAL_COMMUNITY): Payer: Medicare Other

## 2016-07-14 DIAGNOSIS — Z7982 Long term (current) use of aspirin: Secondary | ICD-10-CM | POA: Insufficient documentation

## 2016-07-14 DIAGNOSIS — Z7984 Long term (current) use of oral hypoglycemic drugs: Secondary | ICD-10-CM | POA: Insufficient documentation

## 2016-07-14 DIAGNOSIS — F17210 Nicotine dependence, cigarettes, uncomplicated: Secondary | ICD-10-CM | POA: Diagnosis not present

## 2016-07-14 DIAGNOSIS — I509 Heart failure, unspecified: Secondary | ICD-10-CM | POA: Diagnosis not present

## 2016-07-14 DIAGNOSIS — I110 Hypertensive heart disease with heart failure: Secondary | ICD-10-CM | POA: Insufficient documentation

## 2016-07-14 DIAGNOSIS — I10 Essential (primary) hypertension: Secondary | ICD-10-CM

## 2016-07-14 DIAGNOSIS — E119 Type 2 diabetes mellitus without complications: Secondary | ICD-10-CM | POA: Insufficient documentation

## 2016-07-14 DIAGNOSIS — I5041 Acute combined systolic (congestive) and diastolic (congestive) heart failure: Secondary | ICD-10-CM | POA: Insufficient documentation

## 2016-07-14 LAB — CBC
HEMATOCRIT: 40.9 % (ref 39.0–52.0)
Hemoglobin: 13.7 g/dL (ref 13.0–17.0)
MCH: 29.7 pg (ref 26.0–34.0)
MCHC: 33.5 g/dL (ref 30.0–36.0)
MCV: 88.7 fL (ref 78.0–100.0)
Platelets: 246 10*3/uL (ref 150–400)
RBC: 4.61 MIL/uL (ref 4.22–5.81)
RDW: 13.7 % (ref 11.5–15.5)
WBC: 4.5 10*3/uL (ref 4.0–10.5)

## 2016-07-14 LAB — BASIC METABOLIC PANEL
ANION GAP: 8 (ref 5–15)
BUN: 11 mg/dL (ref 6–20)
CALCIUM: 9.6 mg/dL (ref 8.9–10.3)
CO2: 25 mmol/L (ref 22–32)
Chloride: 105 mmol/L (ref 101–111)
Creatinine, Ser: 0.93 mg/dL (ref 0.61–1.24)
GFR calc non Af Amer: >60 mL/min (ref >60)
GLUCOSE: 135 mg/dL — AB (ref 65–99)
POTASSIUM: 4.1 mmol/L (ref 3.5–5.1)
SODIUM: 138 mmol/L (ref 135–145)

## 2016-07-14 LAB — I-STAT TROPONIN, ED: Troponin i, poc: 0 ng/mL (ref 0.00–0.08)

## 2016-07-14 MED ORDER — CARVEDILOL 3.125 MG PO TABS
3.1250 mg | ORAL_TABLET | Freq: Once | ORAL | Status: AC
  Administered 2016-07-14: 3.125 mg via ORAL
  Filled 2016-07-14: qty 1

## 2016-07-14 NOTE — ED Provider Notes (Signed)
I saw and evaluated the patient, reviewed the resident's note and I agree with the findings and plan.  Pertinent History: The patient is a 65 year old male who complains of having some slight fatigue today and has noticed that he had a slight irregular feeling in his chest described as a heartbeat that was slightly irregular. When he checked his blood pressure at home he noticed that it was 180/117. He reports that he is recently had an increase in his metoprolol dosing and states that he has been taking it for the last 4 days though he does miss occasional doses. He also reports that he misses occasional doses of his Plavix. The patient denies any numbness or weakness to his arms or legs, no changes in vision, he has no chest pain or shortness of breath at this time.  Pertinent Exam findings: On exam the patient is well-appearing with some hypertension, normal pulses bilaterally, no JVD or peripheral edema, clear heart and lung sounds, well appaering otherwise.   EKG Interpretation  Date/Time:  Sunday July 14 2016 19:35:37 EDT Ventricular Rate:  63 PR Interval:  168 QRS Duration: 98 QT Interval:  408 QTC Calculation: 417 R Axis:   -34 Text Interpretation:  Sinus rhythm with Premature atrial complexes Left axis deviation Moderate voltage criteria for LVH, may be normal variant Abnormal ECG Since last tracing rate slower Confirmed by Ajdin Macke  MD, Mckynleigh Mussell ([id number]) on 07/14/2016 8:46:50 PM        Clinical Impression:   Final diagnoses:  Hypertension, unspecified type       I personally interpreted the EKG as well as the resident and agree with the interpretation on the resident's chart.  Final diagnoses:  Hypertension, unspecified type      Noemi Chapel, MD 07/15/16 [phone]
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
## 2016-07-14 NOTE — ED Triage Notes (Signed)
Pt presents to ED after feeling malaised and tired this morning.  He laid down, took a nap and felt better.  Then it returned.  Pt states he has a home BP machine which was reading high BP but was also stating "irregular heart beat".  Irregular pulse felt at triage.

## 2016-08-16 ENCOUNTER — Other Ambulatory Visit: Payer: Self-pay | Admitting: Adult Health

## 2016-12-07 ENCOUNTER — Other Ambulatory Visit: Payer: Self-pay | Admitting: Adult Health

## 2017-01-03 ENCOUNTER — Telehealth: Payer: Self-pay | Admitting: Adult Health

## 2017-01-03 NOTE — Telephone Encounter (Signed)
Pt brought FMLA forms for his wife Eriverto Byrnes [phone]. Pt would like [place] to call Rip Harbour once forms are done and ready for pick up. Placed forms in provider folder in front office area.

## 2017-01-10 NOTE — Telephone Encounter (Signed)
Pt's wife states that someone called her from our office. She is wanting to know if forms have been completed.

## 2017-01-15 NOTE — Telephone Encounter (Signed)
Wife states she has received another call from our office. (?)  Wife states on the recent [place] only put 2 days of absence in 12 months.  would like Kyle Ballard to change to something else.  Pt states she uncertain how often she needs, just every time pt has an episode.  Wife states episodes have been more frequent , and she has gotten points at work.

## 2017-01-16 NOTE — Telephone Encounter (Signed)
FMLA increased to 4 times per 12 months per [facility].  Called patient's and left message that forms are completed. Forms filed at front desk for pick-up.

## 2017-01-23 DIAGNOSIS — Z0289 Encounter for other administrative examinations: Secondary | ICD-10-CM

## 2017-01-23 NOTE — Telephone Encounter (Signed)
Patient picked up FMLA forms.  $20 form fee not paid.

## 2017-01-24 ENCOUNTER — Encounter: Payer: Self-pay | Admitting: Adult Health

## 2017-02-03 ENCOUNTER — Telehealth: Payer: Self-pay | Admitting: Adult Health

## 2017-02-03 NOTE — Telephone Encounter (Signed)
I have received the forms from [employer] and the forms are in his folder

## 2017-02-03 NOTE — Telephone Encounter (Signed)
Pt need the duration of illness on FLMA form. Pt  wife stated [employer] refax [place] the forms. Please call pt

## 2017-02-06 NOTE — Telephone Encounter (Signed)
Form received and on Cory's desk. 

## 2017-02-06 NOTE — Telephone Encounter (Signed)
Kyle Rumps, do you still have paper work?

## 2017-02-07 NOTE — Telephone Encounter (Signed)
I do, it will be filled out by the end of the day

## 2017-02-07 NOTE — Telephone Encounter (Signed)
Filled out and on Cory's desk

## 2017-02-10 NOTE — Telephone Encounter (Signed)
Faxed.  Received confirmation that the fax was successful.

## 2017-02-20 ENCOUNTER — Other Ambulatory Visit: Payer: Self-pay | Admitting: Cardiovascular Disease

## 2017-02-20 DIAGNOSIS — E1169 Type 2 diabetes mellitus with other specified complication: Secondary | ICD-10-CM

## 2017-02-20 DIAGNOSIS — I10 Essential (primary) hypertension: Secondary | ICD-10-CM

## 2017-02-20 DIAGNOSIS — E785 Hyperlipidemia, unspecified: Secondary | ICD-10-CM

## 2017-02-23 ENCOUNTER — Other Ambulatory Visit: Payer: Self-pay | Admitting: Adult Health

## 2017-02-23 DIAGNOSIS — I10 Essential (primary) hypertension: Secondary | ICD-10-CM

## 2017-02-25 ENCOUNTER — Other Ambulatory Visit: Payer: Self-pay | Admitting: Adult Health

## 2017-02-25 DIAGNOSIS — I10 Essential (primary) hypertension: Secondary | ICD-10-CM

## 2017-02-25 MED ORDER — LOSARTAN POTASSIUM 100 MG PO TABS: 100.0000 mg | ORAL_TABLET | Freq: Every day | ORAL | 1 refills | Status: DC

## 2017-02-25 NOTE — Telephone Encounter (Signed)
Duplicate message.  Filled earlier today.

## 2017-02-25 NOTE — Telephone Encounter (Signed)
Sent to the pharmacy by e-scribe. 

## 2017-02-27 ENCOUNTER — Telehealth: Payer: Self-pay

## 2017-02-27 NOTE — Telephone Encounter (Signed)
L MOM to schedule f/u appt with Dr Fletcher Anon

## 2017-02-27 NOTE — Telephone Encounter (Signed)
-----   Message from Clarisse Gouge sent at 02/24/2017  1:49 PM EDT ----- lmov to schedule appt  ----- Message ----- From: Janan Ridge, CMA Sent: 02/20/2017   1:09 PM To: [facility] Support Pool  Can you try to schedule an appointment with Dr. Fletcher Anon, thanks !

## 2017-04-23 ENCOUNTER — Telehealth: Payer: Self-pay | Admitting: *Deleted

## 2017-04-23 ENCOUNTER — Other Ambulatory Visit: Payer: Self-pay | Admitting: Adult Health

## 2017-04-23 NOTE — Telephone Encounter (Signed)
[facility] recommends patient be prescribed a statin due to having a diagnosis of diabetes. He has an expired rx for atorvastatin (30 supply last filled in 2016 by Dr. Raeford Razor) and is overdue for follow-up. Would you like to refill 30 day supply of atorvastatin 10 mg daily and I will contact patient and schedule appt? Thanks!

## 2017-04-23 NOTE — Telephone Encounter (Signed)
Called patient and left message to return call

## 2017-04-23 NOTE — Telephone Encounter (Signed)
I would like him to be on a statin. If he is willing to take the medication, it is ok to refill but needs to come in for a check up.

## 2017-04-24 NOTE — Telephone Encounter (Signed)
Called patient and left message to return call

## 2017-04-25 NOTE — Telephone Encounter (Signed)
Called patient and left message to return call

## 2017-04-28 NOTE — Telephone Encounter (Signed)
I spoke with pt and he declined at this time for statin. He is on the schedule for 05/22/2017 CPE and fasting for labs, would prefer to get labs done first before starting medication.

## 2017-04-28 NOTE — Telephone Encounter (Signed)
Called patient and left message to return call

## 2017-05-22 ENCOUNTER — Telehealth: Payer: Self-pay | Admitting: Adult Health

## 2017-05-22 ENCOUNTER — Ambulatory Visit (INDEPENDENT_AMBULATORY_CARE_PROVIDER_SITE_OTHER): Payer: Medicare Other | Admitting: Adult Health

## 2017-05-22 ENCOUNTER — Encounter: Payer: Self-pay | Admitting: Adult Health

## 2017-05-22 VITALS — BP 130/70 | HR 72 | Temp 98.3°F | Ht 69.0 in | Wt 253.0 lb

## 2017-05-22 DIAGNOSIS — I10 Essential (primary) hypertension: Secondary | ICD-10-CM

## 2017-05-22 DIAGNOSIS — Z125 Encounter for screening for malignant neoplasm of prostate: Secondary | ICD-10-CM | POA: Diagnosis not present

## 2017-05-22 DIAGNOSIS — Z23 Encounter for immunization: Secondary | ICD-10-CM | POA: Diagnosis not present

## 2017-05-22 DIAGNOSIS — E785 Hyperlipidemia, unspecified: Secondary | ICD-10-CM | POA: Diagnosis not present

## 2017-05-22 DIAGNOSIS — E119 Type 2 diabetes mellitus without complications: Secondary | ICD-10-CM | POA: Diagnosis not present

## 2017-05-22 DIAGNOSIS — Z0000 Encounter for general adult medical examination without abnormal findings: Secondary | ICD-10-CM

## 2017-05-22 DIAGNOSIS — Z720 Tobacco use: Secondary | ICD-10-CM | POA: Diagnosis not present

## 2017-05-22 DIAGNOSIS — E1169 Type 2 diabetes mellitus with other specified complication: Secondary | ICD-10-CM

## 2017-05-22 DIAGNOSIS — E669 Obesity, unspecified: Secondary | ICD-10-CM

## 2017-05-22 LAB — CBC WITH DIFFERENTIAL/PLATELET
BASOS ABS: 0 10*3/uL (ref 0.0–0.1)
Basophils Relative: 0.8 % (ref 0.0–3.0)
EOS PCT: 3.6 % (ref 0.0–5.0)
Eosinophils Absolute: 0.1 10*3/uL (ref 0.0–0.7)
HCT: 43.5 % (ref 39.0–52.0)
HEMOGLOBIN: 14.6 g/dL (ref 13.0–17.0)
LYMPHS ABS: 1.7 10*3/uL (ref 0.7–4.0)
Lymphocytes Relative: 42.8 % (ref 12.0–46.0)
MCHC: 33.5 g/dL (ref 30.0–36.0)
MCV: 90.9 fl (ref 78.0–100.0)
MONO ABS: 0.4 10*3/uL (ref 0.1–1.0)
MONOS PCT: 10 % (ref 3.0–12.0)
NEUTROS PCT: 42.8 % — AB (ref 43.0–77.0)
Neutro Abs: 1.7 10*3/uL (ref 1.4–7.7)
Platelets: 294 10*3/uL (ref 150.0–400.0)
RBC: 4.78 Mil/uL (ref 4.22–5.81)
RDW: 13.7 % (ref 11.5–15.5)
WBC: 4 10*3/uL (ref 4.0–10.5)

## 2017-05-22 LAB — TSH: TSH: 1.22 u[IU]/mL (ref 0.35–4.50)

## 2017-05-22 LAB — PSA: PSA: 0.25 ng/mL (ref 0.10–4.00)

## 2017-05-22 LAB — LIPID PANEL
CHOL/HDL RATIO: 7
Cholesterol: 159 mg/dL (ref 0–200)
HDL: 22.6 mg/dL — AB (ref >39.00)
NONHDL: 136.73
Triglycerides: 363 mg/dL — ABNORMAL HIGH (ref 0.0–149.0)
VLDL: 72.6 mg/dL — AB (ref 0.0–40.0)

## 2017-05-22 LAB — HEPATIC FUNCTION PANEL
ALT: 55 U/L — ABNORMAL HIGH (ref 0–53)
AST: 27 U/L (ref 0–37)
Albumin: 4.3 g/dL (ref 3.5–5.2)
Alkaline Phosphatase: 60 U/L (ref 39–117)
BILIRUBIN DIRECT: 0.1 mg/dL (ref 0.0–0.3)
Total Bilirubin: 0.6 mg/dL (ref 0.2–1.2)
Total Protein: 7 g/dL (ref 6.0–8.3)

## 2017-05-22 LAB — BASIC METABOLIC PANEL
BUN: 12 mg/dL (ref 6–23)
CALCIUM: 9.3 mg/dL (ref 8.4–10.5)
CO2: 27 mEq/L (ref 19–32)
Chloride: 104 mEq/L (ref 96–112)
Creatinine, Ser: 0.93 mg/dL (ref 0.40–1.50)
GFR: 104.57 mL/min (ref >60.00)
Glucose, Bld: 139 mg/dL — ABNORMAL HIGH (ref 70–99)
POTASSIUM: 4.4 meq/L (ref 3.5–5.1)
SODIUM: 137 meq/L (ref 135–145)

## 2017-05-22 LAB — HEMOGLOBIN A1C: HEMOGLOBIN A1C: 7.7 % — AB (ref 4.6–6.5)

## 2017-05-22 LAB — LDL CHOLESTEROL, DIRECT: Direct LDL: 83 mg/dL

## 2017-05-22 NOTE — Patient Instructions (Signed)
It was great seeing you today   I will follow up with you regarding your blood work   Please work on quitting smoking as well as weight loss through diet and exercise   Follow up with me in 6 months unless blood work states otherwise

## 2017-05-22 NOTE — Telephone Encounter (Signed)
Copied from [place] [id number]. Topic: Quick Communication - See Telephone Encounter >> May 22, 2017  4:44 PM Cleaster Corin, NT wrote: CRM for notification. See Telephone encounter for:   05/22/17.  Calling back to get lab results pt. Can be reached at [phone]
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
## 2017-05-23 MED ORDER — ATORVASTATIN CALCIUM 40 MG PO TABS: 40.0000 mg | ORAL_TABLET | Freq: Every day | ORAL | 0 refills | Status: DC

## 2017-05-23 NOTE — Telephone Encounter (Signed)
Spoke with patient regarding his labs. Triglycerides continues to be elevated. Advised to go back on Lipitor which he is willing to do. His A1c is up a point from where it was last time. He would like to try to bring it down over the next three months without increasing Metformin

## 2017-08-22 ENCOUNTER — Other Ambulatory Visit: Payer: Self-pay | Admitting: Adult Health

## 2017-08-26 NOTE — Telephone Encounter (Signed)
His A1c is elevated. Needs to come back in for a visit. He can have 30 days to get him through

## 2017-08-26 NOTE — Telephone Encounter (Signed)
Pt called and did schedule follow up, please send in refills.

## 2017-08-26 NOTE — Telephone Encounter (Signed)
Left a message for a return call.

## 2017-08-26 NOTE — Telephone Encounter (Signed)
Last note said to follow up in 3-6 months.  Please advise.

## 2017-08-27 NOTE — Telephone Encounter (Signed)
Sent to the pharmacy by e-scribe. 

## 2017-09-04 ENCOUNTER — Ambulatory Visit: Payer: Medicare Other | Admitting: Adult Health

## 2017-09-10 ENCOUNTER — Encounter: Payer: Self-pay | Admitting: Adult Health

## 2017-09-10 ENCOUNTER — Ambulatory Visit: Payer: Medicare Other | Admitting: Adult Health

## 2017-09-10 VITALS — BP 118/80 | Temp 97.8°F | Wt 249.0 lb

## 2017-09-10 DIAGNOSIS — E118 Type 2 diabetes mellitus with unspecified complications: Secondary | ICD-10-CM

## 2017-09-10 LAB — POCT GLYCOSYLATED HEMOGLOBIN (HGB A1C): Hemoglobin A1C: 7.1
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
## 2017-09-26 ENCOUNTER — Encounter: Payer: Self-pay | Admitting: Adult Health

## 2017-09-26 ENCOUNTER — Ambulatory Visit: Payer: Medicare Other | Admitting: Adult Health

## 2017-09-26 VITALS — BP 142/84 | Temp 97.8°F | Wt 252.0 lb

## 2017-09-26 DIAGNOSIS — H6690 Otitis media, unspecified, unspecified ear: Secondary | ICD-10-CM

## 2017-09-26 MED ORDER — AMOXICILLIN-POT CLAVULANATE 875-125 MG PO TABS: 1.0000 | ORAL_TABLET | Freq: Two times a day (BID) | ORAL | 0 refills | Status: DC
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
## 2017-10-09 ENCOUNTER — Encounter: Payer: Self-pay | Admitting: Adult Health

## 2017-10-09 ENCOUNTER — Ambulatory Visit: Payer: Medicare Other | Admitting: Adult Health

## 2017-10-09 VITALS — BP 130/86 | Temp 97.6°F | Wt 251.0 lb

## 2017-10-09 DIAGNOSIS — H6983 Other specified disorders of Eustachian tube, bilateral: Secondary | ICD-10-CM | POA: Diagnosis not present

## 2017-10-09 DIAGNOSIS — R590 Localized enlarged lymph nodes: Secondary | ICD-10-CM

## 2017-10-09 MED ORDER — FLUTICASONE PROPIONATE 50 MCG/ACT NA SUSP
2.0000 | Freq: Every day | NASAL | 6 refills | Status: DC
Start: 2017-10-09 — End: 2018-04-10

## 2017-10-09 MED ORDER — ICOSAPENT ETHYL 1 G PO CAPS: 2.0000 | ORAL_CAPSULE | Freq: Two times a day (BID) | ORAL | 2 refills | Status: DC
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
## 2017-10-17 ENCOUNTER — Ambulatory Visit
Admission: RE | Admit: 2017-10-17 | Discharge: 2017-10-17 | Disposition: A | Discharge status: Home / Self Care | Payer: Medicare Other | Source: Ambulatory Visit | Attending: Adult Health | Admitting: Adult Health

## 2017-10-17 DIAGNOSIS — R590 Localized enlarged lymph nodes: Secondary | ICD-10-CM

## 2017-11-30 ENCOUNTER — Other Ambulatory Visit: Payer: Self-pay | Admitting: Adult Health

## 2017-12-02 NOTE — Telephone Encounter (Signed)
Left a message for a return call.  Pt due for A1C after 12/11/17.  Also needs to come fasting for recheck of lipid panel.

## 2017-12-03 NOTE — Telephone Encounter (Signed)
Sent to the pharmacy by e-scribe.  I have scheduled the pt to be seen on 12/12/17.

## 2017-12-08 ENCOUNTER — Telehealth: Payer: Self-pay | Admitting: Family Medicine

## 2017-12-08 NOTE — Telephone Encounter (Signed)
Copied from [place] [id number]. Topic: General - Other >> Dec 08, 2017  2:46 PM Keene Breath wrote: Reason for CRM: Patient called to get the status on FMLA forms that were send to the office last week Wednesday.  Patient has not heard anything yet.  Please call patient to advise.  CB# [phone]

## 2017-12-09 NOTE — Telephone Encounter (Signed)
Left a message for a return call.

## 2017-12-10 NOTE — Telephone Encounter (Signed)
Left a message for a return call.

## 2017-12-11 NOTE — Telephone Encounter (Signed)
Spoke to the pt and informed him that I have not received paper work.  He will further investigate.  NO further action required.

## 2017-12-12 ENCOUNTER — Ambulatory Visit: Payer: Medicare Other | Admitting: Adult Health

## 2017-12-12 ENCOUNTER — Encounter: Payer: Self-pay | Admitting: Adult Health

## 2017-12-12 VITALS — BP 130/76 | Temp 97.5°F | Wt 252.0 lb

## 2017-12-12 DIAGNOSIS — E1169 Type 2 diabetes mellitus with other specified complication: Secondary | ICD-10-CM

## 2017-12-12 DIAGNOSIS — E785 Hyperlipidemia, unspecified: Secondary | ICD-10-CM | POA: Diagnosis not present

## 2017-12-12 DIAGNOSIS — E118 Type 2 diabetes mellitus with unspecified complications: Secondary | ICD-10-CM

## 2017-12-12 LAB — POCT GLYCOSYLATED HEMOGLOBIN (HGB A1C): HbA1c, POC (controlled diabetic range): 7.1 % — AB (ref 0.0–7.0)
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
## 2017-12-15 ENCOUNTER — Other Ambulatory Visit: Payer: Self-pay | Admitting: Adult Health

## 2017-12-16 NOTE — Telephone Encounter (Signed)
Assessment & Plan:  1. Type 2 diabetes mellitus with complication, without long-term current use of insulin (HCC) - POC HgB A1c- 7.1 - Has not changed - Encouraged to take medications daily.  - Follow up as CPE or sooner if needed  2. Hyperlipidemia associated with type 2 diabetes mellitus ([place]) - Encouraged to take Vascepa daily  - Needs to focus on weight loss through diet and exercise  - Will check lipid panel at [place], NP    Pt due for cpx 05/2018.  Filled for 6 months.

## 2017-12-17 ENCOUNTER — Telehealth: Payer: Self-pay | Admitting: Adult Health

## 2017-12-17 DIAGNOSIS — Z0279 Encounter for issue of other medical certificate: Secondary | ICD-10-CM

## 2017-12-17 NOTE — Telephone Encounter (Signed)
Pts wife Dravon Nott) dropped off FMLA forms to be completed by the provider.  Pt would like to be called at [phone] when form is ready for pick up.  Form was put in the providers folder for completion.

## 2017-12-18 NOTE — Telephone Encounter (Signed)
Will need to discuss with [facility].  Pt has not been seen by cardiology since 2017.

## 2017-12-19 NOTE — Telephone Encounter (Signed)
Pt's wife came in to office to ask about forms. Advised her of note in chart. She states pt was seen by cards in 2018 (and she went with him), however there are no notes in chart or any appts scheduled that may have been missed. She also states that the deadline for turning in may have passed, I advised pt's wife to call [employer] and let them know [name] was out all week so there was a delay in completing the form. She would like to be called as soon as complete so she can come pick up. I also advised her to contact cardiology about the appt she states was completed last year since chart has no record of this. She states she will call them.   Misty - Please call wife when forms are ready. Thanks!

## 2017-12-21 ENCOUNTER — Other Ambulatory Visit: Payer: Self-pay | Admitting: Adult Health

## 2017-12-21 DIAGNOSIS — I10 Essential (primary) hypertension: Secondary | ICD-10-CM

## 2017-12-25 NOTE — Telephone Encounter (Signed)
PCP has forms and will try to complete before the end of the week. Leigh-can you call wife and update her please?

## 2017-12-25 NOTE — Telephone Encounter (Signed)
Called and spoke with pt and he is aware of the forms being completed and he will come by to the office to pick these up.

## 2018-04-08 ENCOUNTER — Encounter (HOSPITAL_COMMUNITY): Payer: Self-pay | Admitting: Emergency Medicine

## 2018-04-08 ENCOUNTER — Ambulatory Visit: Payer: Self-pay

## 2018-04-08 ENCOUNTER — Ambulatory Visit (HOSPITAL_COMMUNITY)
Admission: EM | Admit: 2018-04-08 | Discharge: 2018-04-08 | Disposition: A | Discharge status: Home / Self Care | Payer: Medicare Other | Attending: Family Medicine | Admitting: Family Medicine

## 2018-04-08 DIAGNOSIS — R079 Chest pain, unspecified: Secondary | ICD-10-CM | POA: Diagnosis not present
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
## 2018-04-08 NOTE — ED Triage Notes (Signed)
Pt states yesterday he had an episode where he had CP and SOB, called his doctor who told him he needed an EKG. Denies CP or SOB at this time.

## 2018-04-08 NOTE — Discharge Instructions (Signed)
EKG was normal Please go to emergency room if you have chest pain again, shortness of breath, difficulty breathing, lightheadedness, dizziness, nausea, vomiting

## 2018-04-08 NOTE — Telephone Encounter (Signed)
Patient had been walking dog yesterday- started sweating got out of breath- just didn't feel good.Patient had chest discomfort and he had to lay down- after 10 minutes things got better.  Today patient went for walk- he states he felt fine.  Reason for Disposition . [1] Intermittent  chest pain or "angina" AND [2] increasing in severity or frequency  (Exception: pains lasting a few seconds)    Patient reports symptoms occurring 24 hours ago- but lasting over 5 minutes. Call to office and due to the severity of the symptoms and the type of symptoms patient experienced- UC/ED advised and they are in agreement.  Answer Assessment - Initial Assessment Questions 1. LOCATION: "Where does it hurt?"       More out of breath and sweating- some chest discomfort. Upper center part of chest 2. RADIATION: "Does the pain go anywhere else?" (e.g., into neck, jaw, arms, back)     no 3. ONSET: "When did the chest pain begin?" (Minutes, hours or days)      10 minutes- patient reports he has felt OK since that episode 4. PATTERN "Does the pain come and go, or has it been constant since it started?"  "Does it get worse with exertion?"      Patient has walked today and does not feel bad now 5. DURATION: "How long does it last" (e.g., seconds, minutes, hours)     10 minutes 6. SEVERITY: "How bad is the pain?"  (e.g., Scale 1-10; mild, moderate, or severe)    - MILD (1-3): doesn't interfere with normal activities     - MODERATE (4-7): interferes with normal activities or awakens from sleep    - SEVERE (8-10): excruciating pain, unable to do any normal activities       No pain now 7. CARDIAC RISK FACTORS: "Do you have any history of heart problems or risk factors for heart disease?" (e.g., prior heart attack, angina; high blood pressure, diabetes, being overweight, high cholesterol, smoking, or strong family history of heart disease)     CHF, diabetes, smoking(on/off) 8. PULMONARY RISK FACTORS: "Do you have any  history of lung disease?"  (e.g., blood clots in lung, asthma, emphysema, birth control pills)     no 9. CAUSE: "What do you think is causing the chest pain?"     Unsure- patient was having symptoms with smoking 10. OTHER SYMPTOMS: "Do you have any other symptoms?" (e.g., dizziness, nausea, vomiting, sweating, fever, difficulty breathing, cough)       None today- patient does report he has felt normal today 11. PREGNANCY: "Is there any chance you are pregnant?" "When was your last menstrual period?"       n/a  Protocols used: CHEST PAIN-A-AH

## 2018-04-09 NOTE — Telephone Encounter (Signed)
Noted.  Will route to [facility] as FYI.

## 2018-04-10 ENCOUNTER — Encounter (HOSPITAL_COMMUNITY): Payer: Self-pay | Admitting: Emergency Medicine

## 2018-04-10 ENCOUNTER — Emergency Department (HOSPITAL_COMMUNITY): Payer: Medicare Other

## 2018-04-10 ENCOUNTER — Observation Stay (HOSPITAL_COMMUNITY)
Admission: EM | Admit: 2018-04-10 | Discharge: 2018-04-11 | Disposition: A | Discharge status: Home / Self Care | Payer: Medicare Other | Attending: Internal Medicine | Admitting: Internal Medicine

## 2018-04-10 ENCOUNTER — Encounter (HOSPITAL_COMMUNITY)
Admission: EM | Disposition: A | Discharge status: Home / Self Care | Payer: Self-pay | Source: Home / Self Care | Attending: Emergency Medicine

## 2018-04-10 ENCOUNTER — Other Ambulatory Visit: Payer: Self-pay

## 2018-04-10 ENCOUNTER — Observation Stay (HOSPITAL_BASED_OUTPATIENT_CLINIC_OR_DEPARTMENT_OTHER): Payer: Medicare Other

## 2018-04-10 DIAGNOSIS — Z7902 Long term (current) use of antithrombotics/antiplatelets: Secondary | ICD-10-CM | POA: Insufficient documentation

## 2018-04-10 DIAGNOSIS — E119 Type 2 diabetes mellitus without complications: Secondary | ICD-10-CM

## 2018-04-10 DIAGNOSIS — I25110 Atherosclerotic heart disease of native coronary artery with unstable angina pectoris: Secondary | ICD-10-CM | POA: Diagnosis not present

## 2018-04-10 DIAGNOSIS — Z7982 Long term (current) use of aspirin: Secondary | ICD-10-CM | POA: Insufficient documentation

## 2018-04-10 DIAGNOSIS — Z79899 Other long term (current) drug therapy: Secondary | ICD-10-CM | POA: Insufficient documentation

## 2018-04-10 DIAGNOSIS — E782 Mixed hyperlipidemia: Secondary | ICD-10-CM

## 2018-04-10 DIAGNOSIS — Z7984 Long term (current) use of oral hypoglycemic drugs: Secondary | ICD-10-CM | POA: Diagnosis not present

## 2018-04-10 DIAGNOSIS — I110 Hypertensive heart disease with heart failure: Secondary | ICD-10-CM | POA: Diagnosis not present

## 2018-04-10 DIAGNOSIS — Z6835 Body mass index (BMI) 35.0-35.9, adult: Secondary | ICD-10-CM | POA: Insufficient documentation

## 2018-04-10 DIAGNOSIS — I200 Unstable angina: Secondary | ICD-10-CM | POA: Diagnosis present

## 2018-04-10 DIAGNOSIS — E1169 Type 2 diabetes mellitus with other specified complication: Secondary | ICD-10-CM | POA: Diagnosis present

## 2018-04-10 DIAGNOSIS — Z888 Allergy status to other drugs, medicaments and biological substances status: Secondary | ICD-10-CM | POA: Diagnosis not present

## 2018-04-10 DIAGNOSIS — Z8249 Family history of ischemic heart disease and other diseases of the circulatory system: Secondary | ICD-10-CM | POA: Diagnosis not present

## 2018-04-10 DIAGNOSIS — E785 Hyperlipidemia, unspecified: Secondary | ICD-10-CM | POA: Insufficient documentation

## 2018-04-10 DIAGNOSIS — I5043 Acute on chronic combined systolic (congestive) and diastolic (congestive) heart failure: Secondary | ICD-10-CM | POA: Diagnosis not present

## 2018-04-10 DIAGNOSIS — I441 Atrioventricular block, second degree: Secondary | ICD-10-CM | POA: Diagnosis not present

## 2018-04-10 DIAGNOSIS — I2584 Coronary atherosclerosis due to calcified coronary lesion: Secondary | ICD-10-CM | POA: Insufficient documentation

## 2018-04-10 DIAGNOSIS — I5041 Acute combined systolic (congestive) and diastolic (congestive) heart failure: Secondary | ICD-10-CM | POA: Diagnosis present

## 2018-04-10 DIAGNOSIS — I10 Essential (primary) hypertension: Secondary | ICD-10-CM | POA: Diagnosis not present

## 2018-04-10 DIAGNOSIS — R079 Chest pain, unspecified: Secondary | ICD-10-CM

## 2018-04-10 DIAGNOSIS — Z955 Presence of coronary angioplasty implant and graft: Secondary | ICD-10-CM | POA: Insufficient documentation

## 2018-04-10 DIAGNOSIS — E669 Obesity, unspecified: Secondary | ICD-10-CM | POA: Diagnosis present

## 2018-04-10 DIAGNOSIS — F17210 Nicotine dependence, cigarettes, uncomplicated: Secondary | ICD-10-CM | POA: Diagnosis not present

## 2018-04-10 DIAGNOSIS — Z720 Tobacco use: Secondary | ICD-10-CM | POA: Diagnosis present

## 2018-04-10 HISTORY — PX: LEFT HEART CATH AND CORONARY ANGIOGRAPHY: CATH118249

## 2018-04-10 LAB — GLUCOSE, CAPILLARY
GLUCOSE-CAPILLARY: 86 mg/dL (ref 70–99)
Glucose-Capillary: 116 mg/dL — ABNORMAL HIGH (ref 70–99)
Glucose-Capillary: 127 mg/dL — ABNORMAL HIGH (ref 70–99)

## 2018-04-10 LAB — CBC
HCT: 44.2 % (ref 39.0–52.0)
Hemoglobin: 14.5 g/dL (ref 13.0–17.0)
MCH: 29.8 pg (ref 26.0–34.0)
MCHC: 32.8 g/dL (ref 30.0–36.0)
MCV: 90.8 fL (ref 80.0–100.0)
Platelets: 258 10*3/uL (ref 150–400)
RBC: 4.87 MIL/uL (ref 4.22–5.81)
RDW: 12.9 % (ref 11.5–15.5)
WBC: 4.5 10*3/uL (ref 4.0–10.5)
nRBC: 0 % (ref 0.0–0.2)

## 2018-04-10 LAB — BASIC METABOLIC PANEL
Anion gap: 9 (ref 5–15)
BUN: 10 mg/dL (ref 8–23)
CO2: 25 mmol/L (ref 22–32)
CREATININE: 1.02 mg/dL (ref 0.61–1.24)
Calcium: 9.6 mg/dL (ref 8.9–10.3)
Chloride: 103 mmol/L (ref 98–111)
GFR calc Af Amer: >60 mL/min (ref >60)
GFR calc non Af Amer: >60 mL/min (ref >60)
Glucose, Bld: 131 mg/dL — ABNORMAL HIGH (ref 70–99)
POTASSIUM: 4.2 mmol/L (ref 3.5–5.1)
Sodium: 137 mmol/L (ref 135–145)

## 2018-04-10 LAB — I-STAT TROPONIN, ED: Troponin i, poc: 0 ng/mL (ref 0.00–0.08)

## 2018-04-10 LAB — TROPONIN I
Troponin I: <0.03 ng/mL (ref <0.03)
Troponin I: <0.03 ng/mL (ref <0.03)

## 2018-04-10 LAB — HEMOGLOBIN A1C
HEMOGLOBIN A1C: 6.3 % — AB (ref 4.8–5.6)
Mean Plasma Glucose: 134.11 mg/dL

## 2018-04-10 LAB — ECHOCARDIOGRAM COMPLETE
Height: 69 in
Weight: 3792 oz

## 2018-04-10 SURGERY — LEFT HEART CATH AND CORONARY ANGIOGRAPHY
Anesthesia: LOCAL

## 2018-04-10 MED ORDER — MIDAZOLAM HCL 2 MG/2ML IJ SOLN
INTRAMUSCULAR | Status: DC | PRN
  Administered 2018-04-10: 1 mg via INTRAVENOUS

## 2018-04-10 MED ORDER — VERAPAMIL HCL 2.5 MG/ML IV SOLN
INTRAVENOUS | Status: AC
  Filled 2018-04-10: qty 2

## 2018-04-10 MED ORDER — INSULIN ASPART 100 UNIT/ML ~~LOC~~ SOLN: 0.0000 [IU] | Freq: Every day | SUBCUTANEOUS | Status: DC

## 2018-04-10 MED ORDER — LOSARTAN POTASSIUM 50 MG PO TABS: 100.0000 mg | ORAL_TABLET | Freq: Every day | ORAL | Status: DC

## 2018-04-10 MED ORDER — ONDANSETRON HCL 4 MG/2ML IJ SOLN: 4.0000 mg | Freq: Four times a day (QID) | INTRAMUSCULAR | Status: DC | PRN

## 2018-04-10 MED ORDER — NITROGLYCERIN 0.4 MG SL SUBL: 0.4000 mg | SUBLINGUAL_TABLET | SUBLINGUAL | Status: DC | PRN

## 2018-04-10 MED ORDER — IOHEXOL 350 MG/ML SOLN
INTRAVENOUS | Status: DC | PRN
  Administered 2018-04-10: 80 mL via INTRA_ARTERIAL

## 2018-04-10 MED ORDER — ONDANSETRON HCL 4 MG PO TABS: 4.0000 mg | ORAL_TABLET | Freq: Four times a day (QID) | ORAL | Status: DC | PRN

## 2018-04-10 MED ORDER — SODIUM CHLORIDE 0.9 % WEIGHT BASED INFUSION: 1.0000 mL/kg/h | INTRAVENOUS | Status: AC

## 2018-04-10 MED ORDER — LIDOCAINE HCL (PF) 1 % IJ SOLN
INTRAMUSCULAR | Status: AC
  Filled 2018-04-10: qty 30

## 2018-04-10 MED ORDER — INSULIN ASPART 100 UNIT/ML ~~LOC~~ SOLN: 0.0000 [IU] | Freq: Three times a day (TID) | SUBCUTANEOUS | Status: DC

## 2018-04-10 MED ORDER — HEPARIN (PORCINE) IN NACL 1000-0.9 UT/500ML-% IV SOLN
INTRAVENOUS | Status: AC
  Filled 2018-04-10: qty 1000

## 2018-04-10 MED ORDER — POLYETHYLENE GLYCOL 3350 17 G PO PACK: 17.0000 g | PACK | Freq: Every day | ORAL | Status: DC | PRN

## 2018-04-10 MED ORDER — FENTANYL CITRATE (PF) 100 MCG/2ML IJ SOLN
INTRAMUSCULAR | Status: AC
  Filled 2018-04-10: qty 2

## 2018-04-10 MED ORDER — ENOXAPARIN SODIUM 40 MG/0.4ML ~~LOC~~ SOLN
40.0000 mg | SUBCUTANEOUS | Status: DC
  Administered 2018-04-11: 40 mg via SUBCUTANEOUS
  Filled 2018-04-10: qty 0.4

## 2018-04-10 MED ORDER — SODIUM CHLORIDE 0.9% FLUSH: 3.0000 mL | Freq: Two times a day (BID) | INTRAVENOUS | Status: DC

## 2018-04-10 MED ORDER — LIDOCAINE HCL (PF) 1 % IJ SOLN
INTRAMUSCULAR | Status: DC | PRN
  Administered 2018-04-10: 2 mL

## 2018-04-10 MED ORDER — SODIUM CHLORIDE 0.9% FLUSH
3.0000 mL | Freq: Two times a day (BID) | INTRAVENOUS | Status: DC
  Administered 2018-04-10 (×2): 3 mL via INTRAVENOUS

## 2018-04-10 MED ORDER — FENTANYL CITRATE (PF) 100 MCG/2ML IJ SOLN
INTRAMUSCULAR | Status: DC | PRN
  Administered 2018-04-10: 25 ug via INTRAVENOUS

## 2018-04-10 MED ORDER — KETOROLAC TROMETHAMINE 30 MG/ML IJ SOLN
30.0000 mg | Freq: Once | INTRAMUSCULAR | Status: AC
  Administered 2018-04-10: 22:00:00 30 mg via INTRAVENOUS
  Filled 2018-04-10: qty 1

## 2018-04-10 MED ORDER — VERAPAMIL HCL 2.5 MG/ML IV SOLN
INTRAVENOUS | Status: DC | PRN
  Administered 2018-04-10: 10 mL via INTRA_ARTERIAL

## 2018-04-10 MED ORDER — HEPARIN (PORCINE) IN NACL 1000-0.9 UT/500ML-% IV SOLN
INTRAVENOUS | Status: DC | PRN
  Administered 2018-04-10 (×2): 500 mL

## 2018-04-10 MED ORDER — CLOPIDOGREL BISULFATE 75 MG PO TABS
75.0000 mg | ORAL_TABLET | Freq: Every day | ORAL | Status: DC
  Filled 2018-04-10 (×2): qty 1

## 2018-04-10 MED ORDER — ACETAMINOPHEN 650 MG RE SUPP: 650.0000 mg | Freq: Four times a day (QID) | RECTAL | Status: DC | PRN

## 2018-04-10 MED ORDER — CARVEDILOL 3.125 MG PO TABS
6.2500 mg | ORAL_TABLET | Freq: Two times a day (BID) | ORAL | Status: DC
  Administered 2018-04-10: 6.25 mg via ORAL
  Filled 2018-04-10: qty 2

## 2018-04-10 MED ORDER — CARVEDILOL 3.125 MG PO TABS: 3.1250 mg | ORAL_TABLET | Freq: Two times a day (BID) | ORAL | Status: DC

## 2018-04-10 MED ORDER — HEPARIN SODIUM (PORCINE) 1000 UNIT/ML IJ SOLN
INTRAMUSCULAR | Status: DC | PRN
  Administered 2018-04-10: 5500 [IU] via INTRAVENOUS

## 2018-04-10 MED ORDER — ISOSORBIDE MONONITRATE ER 30 MG PO TB24
30.0000 mg | ORAL_TABLET | Freq: Every day | ORAL | Status: DC
  Administered 2018-04-10: 30 mg via ORAL
  Filled 2018-04-10: qty 1

## 2018-04-10 MED ORDER — SODIUM CHLORIDE 0.9 % IV SOLN: 250.0000 mL | INTRAVENOUS | Status: DC | PRN

## 2018-04-10 MED ORDER — OMEGA-3-ACID ETHYL ESTERS 1 G PO CAPS: 1.0000 g | ORAL_CAPSULE | Freq: Two times a day (BID) | ORAL | Status: DC

## 2018-04-10 MED ORDER — ATORVASTATIN CALCIUM 80 MG PO TABS: 80.0000 mg | ORAL_TABLET | Freq: Every day | ORAL | Status: DC

## 2018-04-10 MED ORDER — ASPIRIN 81 MG PO CHEW
324.0000 mg | CHEWABLE_TABLET | Freq: Once | ORAL | Status: AC
  Administered 2018-04-10: 324 mg via ORAL
  Filled 2018-04-10: qty 4

## 2018-04-10 MED ORDER — HEPARIN SODIUM (PORCINE) 1000 UNIT/ML IJ SOLN
INTRAMUSCULAR | Status: AC
  Filled 2018-04-10: qty 1

## 2018-04-10 MED ORDER — INSULIN ASPART 100 UNIT/ML ~~LOC~~ SOLN
3.0000 [IU] | Freq: Three times a day (TID) | SUBCUTANEOUS | Status: DC
  Administered 2018-04-11: 08:00:00 3 [IU] via SUBCUTANEOUS

## 2018-04-10 MED ORDER — MIDAZOLAM HCL 2 MG/2ML IJ SOLN
INTRAMUSCULAR | Status: AC
  Filled 2018-04-10: qty 2

## 2018-04-10 MED ORDER — HEPARIN SODIUM (PORCINE) 5000 UNIT/ML IJ SOLN: 5000.0000 [IU] | Freq: Three times a day (TID) | INTRAMUSCULAR | Status: DC

## 2018-04-10 MED ORDER — SODIUM CHLORIDE 0.9% FLUSH: 3.0000 mL | INTRAVENOUS | Status: DC | PRN

## 2018-04-10 MED ORDER — ACETAMINOPHEN 325 MG PO TABS
650.0000 mg | ORAL_TABLET | Freq: Four times a day (QID) | ORAL | Status: DC | PRN
  Administered 2018-04-10: 650 mg via ORAL
  Filled 2018-04-10: qty 2

## 2018-04-10 MED ORDER — TRAMADOL HCL 50 MG PO TABS
50.0000 mg | ORAL_TABLET | Freq: Once | ORAL | Status: AC
  Administered 2018-04-10: 50 mg via ORAL
  Filled 2018-04-10: qty 1

## 2018-04-10 SURGICAL SUPPLY — 14 items
CATH 5FR JL3.5 JR4 ANG PIG MP (CATHETERS) ×1 IMPLANT
CATH INFINITI 5FR AL1 (CATHETERS) ×1 IMPLANT
CATH INFINITI 5FR JK (CATHETERS) ×1 IMPLANT
CATH LAUNCHER 5F RADR (CATHETERS) IMPLANT
CATHETER LAUNCHER 5F RADR (CATHETERS) ×2
DEVICE RAD COMP TR BAND LRG (VASCULAR PRODUCTS) ×1 IMPLANT
GLIDESHEATH SLEND SS 6F .021 (SHEATH) ×1 IMPLANT
GUIDEWIRE INQWIRE 1.5J.035X260 (WIRE) IMPLANT
INQWIRE 1.5J .035X260CM (WIRE) ×2
KIT HEART LEFT (KITS) ×2 IMPLANT
PACK CARDIAC CATHETERIZATION (CUSTOM PROCEDURE TRAY) ×2 IMPLANT
SYR MEDRAD MARK 7 150ML (SYRINGE) ×1 IMPLANT
TRANSDUCER W/STOPCOCK (MISCELLANEOUS) ×2 IMPLANT
TUBING CIL FLEX 10 FLL-RA (TUBING) ×2 IMPLANT
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
## 2018-04-10 NOTE — Progress Notes (Signed)
  Echocardiogram 2D Echocardiogram has been performed.  Kyle Ballard 04/10/2018, 5:40 PM

## 2018-04-10 NOTE — Interval H&P Note (Signed)
History and Physical Interval Note:  04/10/2018 1:12 PM  Kyle Ballard  has presented today for surgery, with the diagnosis of UA  The various methods of treatment have been discussed with the patient and family. After consideration of risks, benefits and other options for treatment, the patient has consented to  Procedure(s): LEFT HEART CATH AND CORONARY ANGIOGRAPHY (N/A) as a surgical intervention .  The patient's history has been reviewed, patient examined, no change in status, stable for surgery.  I have reviewed the patient's chart and labs.  Questions were answered to the patient's satisfaction.   Cath Lab Visit (complete for each Cath Lab visit)  Clinical Evaluation Leading to the Procedure:   ACS: Yes.    Non-ACS:    Anginal Classification: CCS III  Anti-ischemic medical therapy: Minimal Therapy (1 class of medications)  Non-Invasive Test Results: No non-invasive testing performed  Prior CABG: No previous CABG        Kyle Ballard 04/10/2018 1:12 PM
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
## 2018-04-10 NOTE — Progress Notes (Signed)
TRBAND REMOVAL  LOCATION:    right radial  DEFLATED PER PROTOCOL:    Yes.    TIME BAND OFF / DRESSING APPLIED:    1700   SITE UPON ARRIVAL:    Level 0  SITE AFTER BAND REMOVAL:    Level 0  CIRCULATION SENSATION AND MOVEMENT:    Within Normal Limits   Yes.    COMMENTS:   Tolerated procedure well 

## 2018-04-10 NOTE — ED Triage Notes (Signed)
Pt arrives with chest pain for 3-4 days, reports no pain at this time. Reports he called his doctor who sent him for an EKG. Reports today he had some bilateral weakness which prompted his visit to ED.

## 2018-04-11 DIAGNOSIS — E669 Obesity, unspecified: Secondary | ICD-10-CM

## 2018-04-11 DIAGNOSIS — I200 Unstable angina: Secondary | ICD-10-CM | POA: Diagnosis not present

## 2018-04-11 DIAGNOSIS — Z720 Tobacco use: Secondary | ICD-10-CM

## 2018-04-11 DIAGNOSIS — I2584 Coronary atherosclerosis due to calcified coronary lesion: Secondary | ICD-10-CM | POA: Diagnosis not present

## 2018-04-11 DIAGNOSIS — I5043 Acute on chronic combined systolic (congestive) and diastolic (congestive) heart failure: Secondary | ICD-10-CM | POA: Diagnosis not present

## 2018-04-11 DIAGNOSIS — I25110 Atherosclerotic heart disease of native coronary artery with unstable angina pectoris: Secondary | ICD-10-CM | POA: Diagnosis not present

## 2018-04-11 DIAGNOSIS — E785 Hyperlipidemia, unspecified: Secondary | ICD-10-CM

## 2018-04-11 DIAGNOSIS — I5032 Chronic diastolic (congestive) heart failure: Secondary | ICD-10-CM

## 2018-04-11 DIAGNOSIS — I110 Hypertensive heart disease with heart failure: Secondary | ICD-10-CM | POA: Diagnosis not present

## 2018-04-11 DIAGNOSIS — I10 Essential (primary) hypertension: Secondary | ICD-10-CM | POA: Diagnosis not present

## 2018-04-11 LAB — LIPID PANEL
Cholesterol: 147 mg/dL (ref 0–200)
HDL: 17 mg/dL — ABNORMAL LOW (ref >40)
LDL Cholesterol: 85 mg/dL (ref 0–99)
Total CHOL/HDL Ratio: 8.6 RATIO
Triglycerides: 223 mg/dL — ABNORMAL HIGH (ref <150)
VLDL: 45 mg/dL — ABNORMAL HIGH (ref 0–40)

## 2018-04-11 LAB — CBC
HCT: 37.4 % — ABNORMAL LOW (ref 39.0–52.0)
Hemoglobin: 12 g/dL — ABNORMAL LOW (ref 13.0–17.0)
MCH: 28.9 pg (ref 26.0–34.0)
MCHC: 32.1 g/dL (ref 30.0–36.0)
MCV: 90.1 fL (ref 80.0–100.0)
NRBC: 0 % (ref 0.0–0.2)
Platelets: 255 10*3/uL (ref 150–400)
RBC: 4.15 MIL/uL — ABNORMAL LOW (ref 4.22–5.81)
RDW: 12.8 % (ref 11.5–15.5)
WBC: 5.3 10*3/uL (ref 4.0–10.5)

## 2018-04-11 LAB — BASIC METABOLIC PANEL
ANION GAP: 7 (ref 5–15)
BUN: 13 mg/dL (ref 8–23)
CO2: 21 mmol/L — ABNORMAL LOW (ref 22–32)
Calcium: 8.8 mg/dL — ABNORMAL LOW (ref 8.9–10.3)
Chloride: 109 mmol/L (ref 98–111)
Creatinine, Ser: 0.99 mg/dL (ref 0.61–1.24)
GFR calc non Af Amer: >60 mL/min (ref >60)
Glucose, Bld: 125 mg/dL — ABNORMAL HIGH (ref 70–99)
Potassium: 4.1 mmol/L (ref 3.5–5.1)
Sodium: 137 mmol/L (ref 135–145)

## 2018-04-11 LAB — GLUCOSE, CAPILLARY: GLUCOSE-CAPILLARY: 108 mg/dL — AB (ref 70–99)

## 2018-04-11 LAB — HIV ANTIBODY (ROUTINE TESTING W REFLEX): HIV SCREEN 4TH GENERATION: NONREACTIVE

## 2018-04-11 MED ORDER — ISOSORBIDE MONONITRATE ER 30 MG PO TB24: 15.0000 mg | ORAL_TABLET | Freq: Every day | ORAL | 1 refills | Status: DC

## 2018-04-11 MED ORDER — ATORVASTATIN CALCIUM 20 MG PO TABS: 20.0000 mg | ORAL_TABLET | Freq: Every day | ORAL | 1 refills | Status: DC

## 2018-04-11 MED ORDER — ATORVASTATIN CALCIUM 10 MG PO TABS: 20.0000 mg | ORAL_TABLET | Freq: Every day | ORAL | Status: DC

## 2018-04-11 MED ORDER — CARVEDILOL 6.25 MG PO TABS: 6.2500 mg | ORAL_TABLET | Freq: Two times a day (BID) | ORAL | 1 refills | Status: DC

## 2018-04-11 MED ORDER — ISOSORBIDE MONONITRATE ER 30 MG PO TB24
15.0000 mg | ORAL_TABLET | Freq: Every day | ORAL | Status: DC
  Administered 2018-04-11: 08:00:00 15 mg via ORAL
  Filled 2018-04-11: qty 1

## 2018-04-11 NOTE — Discharge Summary (Signed)
Physician Discharge Summary  Kyle Ballard GEX:[id number] DOB: 1950/05/15 DOA: 04/10/2018  PCP: Dorothyann Peng, NP  Admit date: 04/10/2018 Discharge date: 04/11/2018  Time spent: 45 minutes  Recommendations for Outpatient Follow-up:  1. Follow up with cardiology as instructed  2. Take medications as prescribed   Discharge Diagnoses:  Principal Problem:   Unstable angina ([place]) Active Problems:   Acute combined systolic and diastolic CHF, NYHA class 1 (HCC)   Obesity (BMI 30-39.9)   Diabetes mellitus, type 2 ([place])   Hypertension   Hyperlipidemia associated with type 2 diabetes mellitus ([place])   Tobacco abuse   Discharge Condition: stable  Diet recommendation: heart healthy carb modified  Filed Weights   04/10/18 0803 04/11/18 0621  Weight: 107.5 kg 108 kg    History of present illness:  Kyle Ballard is a 67 y.o. male with a hx of mild nonobstructive CAD, hypertension, hyperlipidemia, diabetes, chronic diastolic heart failure, ongoing tobacco smoking and obesity who presented with chest pain and admitted for unstable angina and underwent cath.   Hospital Course:  1. Unstable angina History of mild nonobstructive disease by cardiac catheterization in October 2015.  Treated with dual antiplatelet and antianginal therapy.  No cardiac follow-up since then.  Risk factors include hypertension, hyperlipidemia, diabetes, obesity, CHF and ongoing tobacco smoking.  EKG without acute ischemic changes.  Point-of-care troponin negative. Evaluated by cards who recommended cath. Cath revealed severe small branch obstructive CAD too small for PCI. Recommended medial management. Coreg increased and imdur added as well as low dose lipitor. Headache after 30mg  imdur so dose decreased to 15mg . At discharge patient pain free   2.  Chronic diastolic heart failure. Echo reveals normal LVEF and grade 1 DD. Cards recommended continuing BB and ARB.  3.  Hypertension -Blood pressure  was elevated at 169/100 at presentation. Fair control at discharge. Coreg increased as noted above. Continue losartan  4.  Hyperlipidemia -05/22/2017: Cholesterol 159; HDL 22.60; Triglycerides 363.0; VLDL 72.6  -Start Lipitor 20 mg daily, adjust based on lab. Hx intolerance to statin. Agrees to try low dose lipitor  5. DM. A1c 6.1. Follow up with PCP continue home regimen  Procedures: cathEchocardiogram 04/10/18 Study Conclusions  - Left ventricle: The cavity size was normal. Wall thickness was increased in a pattern of mild LVH. Systolic function was normal. The estimated ejection fraction was in the range of 50% to 55%. Wall motion was normal; there were no regional wall motion abnormalities. Doppler parameters are consistent with abnormal left ventricular relaxation (grade 1 diastolic dysfunction). - Aorta: Aortic root dimension: 38 mm (ED  LEFT HEART CATH AND CORONARY ANGIOGRAPHY 04/10/18  Conclusion     Ost 1st Diag to 1st Diag lesion is 90% stenosed.  Lat Ramus lesion is 85% stenosed.  2nd Mrg lesion is 60% stenosed.  RPDA lesion is 50% stenosed.  LV end diastolic pressure is normal.  1. Small branch obstructive CAD. - 90% ostial small first diagonal.  - 85% sub-branch of the ramus intermediate after trifurcation - 60% OM 2 - 50% PDA 2. Diffuse severe ectasia of the RCA 3. Normal LVEDP  Plan: medical management. Obstructive lesions involve vessels too small for PCI. Will increase Coreg to 6.25 mg bid and add Imdur 30 mg daily. Aggressive lipid management.        Consultations:  [facility] cardiology  Discharge Exam: Vitals:   04/11/18 0621 04/11/18 0802  BP: 94/69   Pulse: 66 68  Resp: 15   Temp: 98.3 F (36.8 C)  98.1 F (36.7 C)  SpO2: 98% 98%    General: sitting on side of bed in no acute distress Cardiovascular: HS distant no MGR rrr no LE edema Respiratory: normal effort BS distant but clear no wheeze or  rhonchi  Discharge Instructions   Discharge Instructions    Call MD for:  difficulty breathing, headache or visual disturbances   Complete by:  As directed    Call MD for:  persistant dizziness or light-headedness   Complete by:  As directed    Call MD for:  redness, tenderness, or signs of infection (pain, swelling, redness, odor or green/yellow discharge around incision site)   Complete by:  As directed    Diet - low sodium heart healthy   Complete by:  As directed    Discharge instructions   Complete by:  As directed    Take medications as prescribed Follow up with cardiology as instructed   Increase activity slowly   Complete by:  As directed      Allergies as of 04/11/2018      Reactions   Lisinopril Swelling   Benadryl [diphenhydramine Hcl] Other (See Comments)   Not really sure if has allergy; but past concern may have caused lip swelling       Medication List    STOP taking these medications   Icosapent Ethyl 1 g Caps     TAKE these medications   aspirin 81 MG EC tablet Take 1 tablet (81 mg total) by mouth daily.   atorvastatin 20 MG tablet Commonly known as:  LIPITOR Take 1 tablet (20 mg total) by mouth daily at 6 PM.   carvedilol 6.25 MG tablet Commonly known as:  COREG Take 1 tablet (6.25 mg total) by mouth 2 (two) times daily with a meal. What changed:    medication strength  See the new instructions.   CINNAMON PO Take 2 tablets by mouth 2 (two) times daily.   clopidogrel 75 MG tablet Commonly known as:  PLAVIX TAKE ONE TABLET BY MOUTH ONCE DAILY   FISH OIL PO Take 2 capsules by mouth daily at 2 PM.   isosorbide mononitrate 30 MG 24 hr tablet Commonly known as:  IMDUR Take 0.5 tablets (15 mg total) by mouth daily. Start taking on:  04/12/2018   losartan 100 MG tablet Commonly known as:  COZAAR TAKE 1 TABLET BY MOUTH ONCE DAILY   metFORMIN 500 MG tablet Commonly known as:  GLUCOPHAGE TAKE 1 TABLET BY MOUTH TWICE DAILY WITH  A   MEAL What changed:  See the new instructions.   NITROSTAT 0.4 MG SL tablet Generic drug:  nitroGLYCERIN DISSOLVE ONE TABLET UNDER THE TONGUE EVERY 5 MINUTES AS NEEDED FOR CHEST PAIN. What changed:  See the new instructions.      Allergies  Allergen Reactions  . Lisinopril Swelling  . Benadryl [Diphenhydramine Hcl] Other (See Comments)    Not really sure if has allergy; but past concern may have caused lip swelling    Follow-up Information    Lorretta Harp, MD. Go on 05/01/2018.   Specialties:  Cardiology, Radiology Why:  @2 :30pm for hospital follow up. Please arrive 15 minutes early  Contact information: [address] [place] [address] [phone]            The results of significant diagnostics from this hospitalization (including imaging, microbiology, ancillary and laboratory) are listed below for reference.    Significant Diagnostic Studies: Dg Chest 2 View  Result Date: 04/10/2018  CLINICAL DATA:  Mid chest pain for a few days. Shortness of breath with exertion. EXAM: CHEST - 2 VIEW COMPARISON:  07/14/2016 FINDINGS: The cardiac silhouette remains borderline enlarged. There is slight chronic interstitial prominence without overt edema, focal airspace consolidation, pleural effusion, or pneumothorax. No acute osseous abnormality is seen. IMPRESSION: No active cardiopulmonary disease. Electronically Signed   By: Logan Bores M.D.   On: 04/10/2018 08:47    Microbiology: No results found for this or any previous visit (from the past 240 hour(s)).   Labs: Basic Metabolic Panel: Recent Labs  Lab 04/10/18 0805 04/11/18 0159  NA 137 137  K 4.2 4.1  CL 103 109  CO2 25 21*  GLUCOSE 131* 125*  BUN 10 13  CREATININE 1.02 0.99  CALCIUM 9.6 8.8*   Liver Function Tests: No results for input(s): AST, ALT, ALKPHOS, BILITOT, PROT, ALBUMIN in the last 168 hours. No results for input(s): LIPASE, AMYLASE in the last 168 hours. No results for  input(s): AMMONIA in the last 168 hours. CBC: Recent Labs  Lab 04/10/18 0805 04/11/18 0159  WBC 4.5 5.3  HGB 14.5 12.0*  HCT 44.2 37.4*  MCV 90.8 90.1  PLT 258 255   Cardiac Enzymes: Recent Labs  Lab 04/10/18 1007 04/10/18 2159  TROPONINI <0.03 <0.03   BNP: BNP (last 3 results) No results for input(s): BNP in the last 8760 hours.  ProBNP (last 3 results) No results for input(s): PROBNP in the last 8760 hours.  CBG: Recent Labs  Lab 04/10/18 1432 04/10/18 1819 04/10/18 2102 04/11/18 0623  GLUCAP 86 116* 127* 108*       Signed:  Radene Gunning NP [facility] 04/11/2018, 9:29 AM

## 2018-04-11 NOTE — Progress Notes (Addendum)
Progress Note  Patient Name: Kyle Ballard Date of Encounter: 04/11/2018  Primary Cardiologist: Will be followed by Dr. Gwenlyn Found   Subjective   No chest pain or dyspnea. Had severe headache after getting Imdur yesterday. Resolved with Toradol.   Inpatient Medications    Scheduled Meds: . atorvastatin  20 mg Oral q1800  . carvedilol  6.25 mg Oral BID WC  . clopidogrel  75 mg Oral Daily  . enoxaparin (LOVENOX) injection  40 mg Subcutaneous Q24H  . insulin aspart  0-15 Units Subcutaneous TID WC  . insulin aspart  0-5 Units Subcutaneous QHS  . insulin aspart  3 Units Subcutaneous TID WC  . isosorbide mononitrate  15 mg Oral Daily  . losartan  100 mg Oral Daily  . omega-3 acid ethyl esters  1 g Oral BID  . sodium chloride flush  3 mL Intravenous Q12H  . sodium chloride flush  3 mL Intravenous Q12H   Continuous Infusions: . sodium chloride     PRN Meds: sodium chloride, acetaminophen **OR** acetaminophen, nitroGLYCERIN, ondansetron **OR** ondansetron (ZOFRAN) IV, polyethylene glycol, sodium chloride flush   Vital Signs    Vitals:   04/10/18 1420 04/10/18 1535 04/10/18 1952 04/11/18 0621  BP: (!) 143/89 (!) 117/99 99/64 94/69   Pulse: 64 75 (!) 54 66  Resp: 15 (!) 22 14 15   Temp: 98.3 F (36.8 C) 97.7 F (36.5 C) 97.8 F (36.6 C) 98.3 F (36.8 C)  TempSrc: Oral Oral Oral Oral  SpO2: 100% 100% 100% 98%  Weight:    108 kg  Height:        Intake/Output Summary (Last 24 hours) at 04/11/2018 0746 Last data filed at 04/11/2018 0115 Gross per 24 hour  Intake 1435 ml  Output 600 ml  Net 835 ml   Filed Weights   04/10/18 0803 04/11/18 0621  Weight: 107.5 kg 108 kg    Telemetry    Sinus rhythm with frequent PACs and PVCs - Personally Reviewed  ECG    Sinus rhythm  - Personally Reviewed  Physical Exam   GEN: No acute distress.   Neck: No JVD Cardiac: RRR, no murmurs, rubs, or gallops. Right radial cath site without hematoma Respiratory: Clear to  auscultation bilaterally. GI: Soft, nontender, non-distended  MS: No edema; No deformity. Neuro:  Nonfocal  Psych: Normal affect   Labs    Chemistry Recent Labs  Lab 04/10/18 0805 04/11/18 0159  NA 137 137  K 4.2 4.1  CL 103 109  CO2 25 21*  GLUCOSE 131* 125*  BUN 10 13  CREATININE 1.02 0.99  CALCIUM 9.6 8.8*  GFRNONAA >60 >60  GFRAA >60 >60  ANIONGAP 9 7     Hematology Recent Labs  Lab 04/10/18 0805 04/11/18 0159  WBC 4.5 5.3  RBC 4.87 4.15*  HGB 14.5 12.0*  HCT 44.2 37.4*  MCV 90.8 90.1  MCH 29.8 28.9  MCHC 32.8 32.1  RDW 12.9 12.8  PLT 258 255    Cardiac Enzymes Recent Labs  Lab 04/10/18 1007 04/10/18 2159  TROPONINI <0.03 <0.03    Recent Labs  Lab 04/10/18 0809  TROPIPOC 0.00     Radiology    Dg Chest 2 View  Result Date: 04/10/2018 CLINICAL DATA:  Mid chest pain for a few days. Shortness of breath with exertion. EXAM: CHEST - 2 VIEW COMPARISON:  07/14/2016 FINDINGS: The cardiac silhouette remains borderline enlarged. There is slight chronic interstitial prominence without overt edema, focal airspace consolidation, pleural effusion, or pneumothorax.  No acute osseous abnormality is seen. IMPRESSION: No active cardiopulmonary disease. Electronically Signed   By: Logan Bores M.D.   On: 04/10/2018 08:47    Cardiac Studies   Echocardiogram 04/10/18 Study Conclusions  - Left ventricle: The cavity size was normal. Wall thickness was   increased in a pattern of mild LVH. Systolic function was normal.   The estimated ejection fraction was in the range of 50% to 55%.   Wall motion was normal; there were no regional wall motion   abnormalities. Doppler parameters are consistent with abnormal   left ventricular relaxation (grade 1 diastolic dysfunction). - Aorta: Aortic root dimension: 38 mm (ED  LEFT HEART CATH AND CORONARY ANGIOGRAPHY 04/10/18  Conclusion     Ost 1st Diag to 1st Diag lesion is 90% stenosed.  Lat Ramus lesion is 85%  stenosed.  2nd Mrg lesion is 60% stenosed.  RPDA lesion is 50% stenosed.  LV end diastolic pressure is normal.   1. Small branch obstructive CAD.    - 90% ostial small first diagonal.     - 85% sub-branch of the ramus intermediate after trifurcation    - 60% OM 2    - 50% PDA 2. Diffuse severe ectasia of the RCA 3. Normal LVEDP  Plan: medical management. Obstructive lesions involve vessels too small for PCI. Will increase Coreg to 6.25 mg bid and add Imdur 30 mg daily. Aggressive lipid management.    Diagnostic  Dominance: Right     Patient Profile     Kyle Ballard is a 67 y.o. male with a hx of mild nonobstructive CAD by cath in 2015, hypertension, hyperlipidemia, diabetes, chronic diastolic heart failure, ongoing tobacco smoking and obesity who is being seen for the evaluation of chest pain at the request of Dr. Evangeline Gula.  Assessment & Plan    1. Unstable angina Presented with symptoms concerning for unstable angina for past 1 week.  Cath showed severe small branch obstructive CAD as noted above. vessels are too small for PCI. Recommended medical management. Increased Coreg to 6.25 mg bid and add Imdur 30 mg daily. However patient had severe headache after getting Imdur which resolved with Toradol. The patient is willing to try imdur 15mg  prior to discharge this morning. Continue ASA and Plavix. Continue statin as below.   2.  Chronic diastolic heart failure -Echo showed normal LVEF and grade 1 DD.  Euvolemic by exam. Continue BB and ARB.   3.  Hypertension -Blood pressure was elevated at 169/100 at presentation.  Now stable.  Continue current medications.  4.  Hyperlipidemia -04/11/2018: Cholesterol 147; HDL 17; LDL Cholesterol 85; Triglycerides 223; VLDL 45  - The patient reports prior hx of myalgia on statin but does not remember name of medications he has tried. After long discussion, he is willing to try lipitor 20mg , up-titrate as outpatient if tolerates.  Otherwise, lipid clinic referral for PCSK9 inhibitor.   5. Tobacco smoking - advised cessation  6. Morbid obesity - He lives sedentary lifestyle by playing video games. He is now wants to change his lifestyle. Encouraged daily exercise, hearth healthy diet and weight loss.     [facility] will sign off.   Medication Recommendations:  As above Other recommendations (labs, testing, etc):  none Follow up as an outpatient:  Will schedule   For questions or updates, please contact [facility] Please consult [email or web address] for contact info under        SignedLeanor Kail, PA  04/11/2018, 7:46 AM  The patient was seen and examined, and I agree with the history, physical exam, assessment and plan as documented above, with modifications as noted below.  He is doing well this morning and eager to go home. He had difficulty sleeping due to a severe headache. We had a discussion about nitrates and how they alleviate symptoms. Will reduce dose of Imdur. Continue Coreg and statin. LV systolic function is normal.  Stable for discharge from my standpoint.   Will sign off.  Kate Sable, MD, [facility]  04/11/2018 8:19 AM

## 2018-04-13 ENCOUNTER — Encounter (HOSPITAL_COMMUNITY): Payer: Self-pay | Admitting: Cardiology

## 2018-04-14 ENCOUNTER — Ambulatory Visit: Payer: Medicare Other | Admitting: Adult Health

## 2018-04-14 ENCOUNTER — Other Ambulatory Visit: Payer: Self-pay

## 2018-04-14 NOTE — Patient Outreach (Signed)
[facility]) Care Management  04/14/2018  Ramiz Turpin [facility] [birth year] [id number]   Medication Adherence call to Mr. Ari Bernabei left a message for patient to call back patient is due on Metformin 500 mg. Mr. Deleonardis is showing past due under [place].   [facility] Management Direct Dial [phone]  Fax [phone] [email or web address]

## 2018-05-01 ENCOUNTER — Encounter: Payer: Self-pay | Admitting: Cardiovascular Disease

## 2018-05-01 ENCOUNTER — Ambulatory Visit: Payer: Medicare Other | Admitting: Cardiovascular Disease

## 2018-05-01 DIAGNOSIS — I10 Essential (primary) hypertension: Secondary | ICD-10-CM

## 2018-05-01 DIAGNOSIS — I5041 Acute combined systolic (congestive) and diastolic (congestive) heart failure: Secondary | ICD-10-CM

## 2018-05-01 DIAGNOSIS — E1169 Type 2 diabetes mellitus with other specified complication: Secondary | ICD-10-CM | POA: Diagnosis not present

## 2018-05-01 DIAGNOSIS — I200 Unstable angina: Secondary | ICD-10-CM | POA: Diagnosis not present

## 2018-05-01 DIAGNOSIS — E785 Hyperlipidemia, unspecified: Secondary | ICD-10-CM

## 2018-05-01 DIAGNOSIS — Z720 Tobacco use: Secondary | ICD-10-CM

## 2018-05-01 NOTE — Patient Instructions (Signed)
Medication Instructions:  Your physician recommends that you continue on your current medications as directed. Please refer to the Current Medication list given to you today.  If you need a refill on your cardiac medications before your next appointment, please call your pharmacy.   Lab work: NONE If you have labs (blood work) drawn today and your tests are completely normal, you will receive your results only by: Marland Kitchen MyChart Message (if you have MyChart) OR . A paper copy in the mail If you have any lab test that is abnormal or we need to change your treatment, we will call you to review the results.  Testing/Procedures: NONE  Follow-Up: At [facility], you and your health needs are our priority.  As part of our continuing mission to provide you with exceptional heart care, we have created designated Provider Care Teams.  These Care Teams include your primary Cardiologist (physician) and Advanced Practice Providers (APPs -  Physician Assistants and Nurse Practitioners) who all work together to provide you with the care you need, when you need it. You will need a follow up appointment in 6 months with an APP and 12 months with Dr. Gwenlyn Found.  Please call our office 2 months in advance to schedule each appointment.  You may see one of the following Advanced Practice Providers on your designated Care Team:   Kerin Ransom, PA-C Roby Lofts, [place] . Sande Rives, PA-C

## 2018-05-01 NOTE — Assessment & Plan Note (Signed)
History of hyperlipidemia on statin therapy with lipid profile performed 04/11/2018 revealing a total cholesterol 147, LDL of 85 and HDL of 17.

## 2018-05-01 NOTE — Assessment & Plan Note (Signed)
Long history tobacco abuse currently trying to stop.  He has not smoked in the last 5 days.

## 2018-05-01 NOTE — Progress Notes (Signed)
05/01/2018 Kyle Ballard [facility]   [birth year]  [id number]  Primary Physician Kyle Ballard, Kyle Rumps, NP Primary Cardiologist: Kyle Harp MD Kyle Ballard, [place]  HPI:  Kyle Ballard is a 67 y.o. 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  He has a history of treated hypertension, diabetes and hyperlipidemia.  He smoked for many years but is currently trying to quit.  He had cardiac catheterization performed October 2015 revealing moderate nonobstructive CAD.  Medical therapy was recommended.  Because of accelerated angina he was admitted to the hospital 04/10/2018 and underwent diagnostic cath by Dr. Martinique revealing moderate branch vessel disease with obstructive lesions lesions and vessels too small for PCI.  His medicines were adjusted, long acting nitrate was added and medical therapy was recommended.  Since discharge his angina has markedly improved.  He is trying to stop smoking.   Current Meds  Medication Sig  . aspirin EC 81 MG EC tablet Take 1 tablet (81 mg total) by mouth daily.  Marland Kitchen atorvastatin (LIPITOR) 20 MG tablet Take 1 tablet (20 mg total) by mouth daily at 6 PM.  . carvedilol (COREG) 6.25 MG tablet Take 1 tablet (6.25 mg total) by mouth 2 (two) times daily with a meal.  . CINNAMON PO Take 2 tablets by mouth 2 (two) times daily.  . clopidogrel (PLAVIX) 75 MG tablet TAKE ONE TABLET BY MOUTH ONCE DAILY (Patient taking differently: Take 75 mg by mouth daily. )  . isosorbide mononitrate (IMDUR) 30 MG 24 hr tablet Take 0.5 tablets (15 mg total) by mouth daily.  Marland Kitchen losartan (COZAAR) 100 MG tablet TAKE 1 TABLET BY MOUTH ONCE DAILY (Patient taking differently: Take 100 mg by mouth daily. )  . metFORMIN (GLUCOPHAGE) 500 MG tablet TAKE 1 TABLET BY MOUTH TWICE DAILY WITH  A  MEAL (Patient taking differently: Take 500 mg by mouth 2 (two) times daily with a meal. )  .  NITROSTAT 0.4 MG SL tablet DISSOLVE ONE TABLET UNDER THE TONGUE EVERY 5 MINUTES AS NEEDED FOR CHEST PAIN. (Patient taking differently: Place 0.4 mg under the tongue every 5 (five) minutes as needed for chest pain. )  . Omega-3 Fatty Acids (FISH OIL PO) Take 2 capsules by mouth daily at 2 PM.     Allergies  Allergen Reactions  . Lisinopril Swelling  . Benadryl [Diphenhydramine Hcl] Other (See Comments)    Not really sure if has allergy; but past concern may have caused lip swelling     Social History   Socioeconomic History  . Marital status: Married    Spouse name: Not on file  . Number of children: 2  . Years of education: Not on file  . Highest education level: Not on file  Occupational History  . Occupation: retired  Scientific laboratory technician  . Financial resource strain: Not on file  . Food insecurity:    Worry: Not on file    Inability: Not on file  . Transportation needs:    Medical: Not on file    Non-medical: Not on file  Tobacco Use  . Smoking status: Current Some Day Smoker    Packs/day: 0.25    Types: Cigarettes  . Smokeless tobacco: Never Used  Substance and Sexual Activity  . Alcohol use: No    Alcohol/week: 0.0 standard drinks  . Drug use: No  . Sexual activity: Not on file  Lifestyle  .  Physical activity:    Days per week: Not on file    Minutes per session: Not on file  . Stress: Not on file  Relationships  . Social connections:    Talks on phone: Not on file    Gets together: Not on file    Attends religious service: Not on file    Active member of club or organization: Not on file    Attends meetings of clubs or organizations: Not on file    Relationship status: Not on file  . Intimate partner violence:    Fear of current or ex partner: Not on file    Emotionally abused: Not on file    Physically abused: Not on file    Forced sexual activity: Not on file  Other Topics Concern  . Not on file  Social History Narrative   Works at [employer]; His wife is on  custodial staff at [employer]; Son in [place]     Review of Systems: General: negative for chills, fever, night sweats or weight changes.  Cardiovascular: negative for chest pain, dyspnea on exertion, edema, orthopnea, palpitations, paroxysmal nocturnal dyspnea or shortness of breath Dermatological: negative for rash Respiratory: negative for cough or wheezing Urologic: negative for hematuria Abdominal: negative for nausea, vomiting, diarrhea, bright red blood per rectum, melena, or hematemesis Neurologic: negative for visual changes, syncope, or dizziness All other systems reviewed and are otherwise negative except as noted above.    Blood pressure 110/70, pulse 65, height 5\' 10"  (1.778 m), weight 245 lb (111.1 kg).  General appearance: alert and no distress Neck: no adenopathy, no carotid bruit, no JVD, supple, symmetrical, trachea midline and thyroid not enlarged, symmetric, no tenderness/mass/nodules Lungs: clear to auscultation bilaterally Heart: regular rate and rhythm, S1, S2 normal, no murmur, click, rub or gallop Extremities: extremities normal, atraumatic, no cyanosis or edema Pulses: 2+ and symmetric Skin: Skin color, texture, turgor normal. No rashes or lesions Neurologic: Alert and oriented X 3, normal strength and tone. Normal symmetric reflexes. Normal coordination and gait  EKG not performed today  ASSESSMENT AND PLAN:   Hypertension History of essential hypertension blood pressure measured today 110/70.  He is on carvedilol and losartan.  Continue current meds at current dosing.  Acute combined systolic and diastolic CHF, NYHA class 1 ([place]) Echo performed 04/10/2018 revealed normal LV systolic function with grade 1 diastolic dysfunction.  Hyperlipidemia associated with type 2 diabetes mellitus ([place]) History of hyperlipidemia on statin therapy with lipid profile performed 04/11/2018 revealing a total cholesterol 147, LDL of 85 and HDL of 17.  Unstable angina  (HCC) History of CAD status post cardiac catheterization October 2015 revealing moderate CAD treated medically.  He was admitted recently with unstable angina on 04/10/2018 and underwent radial diagnostic cath by Dr. Martinique the following day revealing moderate branch vessel disease.  Medical therapy was recommended.  Since discharge his chest pain is markedly improved.  Tobacco abuse Long history tobacco abuse currently trying to stop.  He has not smoked in the last 5 days.      Kyle Harp MD FACP,FACC,FAHA, [facility] 05/01/2018 3:01 PM

## 2018-05-01 NOTE — Assessment & Plan Note (Signed)
History of CAD status post cardiac catheterization October 2015 revealing moderate CAD treated medically.  He was admitted recently with unstable angina on 04/10/2018 and underwent radial diagnostic cath by Dr. Martinique the following day revealing moderate branch vessel disease.  Medical therapy was recommended.  Since discharge his chest pain is markedly improved.

## 2018-05-01 NOTE — Assessment & Plan Note (Signed)
History of essential hypertension blood pressure measured today 110/70.  He is on carvedilol and losartan.  Continue current meds at current dosing.

## 2018-05-01 NOTE — Assessment & Plan Note (Signed)
Echo performed 04/10/2018 revealed normal LV systolic function with grade 1 diastolic dysfunction.

## 2018-06-18 ENCOUNTER — Other Ambulatory Visit: Payer: Self-pay

## 2018-06-18 MED ORDER — ATORVASTATIN CALCIUM 20 MG PO TABS: 20.0000 mg | ORAL_TABLET | Freq: Every day | ORAL | 10 refills | Status: DC

## 2018-06-18 MED ORDER — CARVEDILOL 6.25 MG PO TABS: 6.2500 mg | ORAL_TABLET | Freq: Two times a day (BID) | ORAL | 10 refills | Status: DC

## 2018-06-18 NOTE — Telephone Encounter (Signed)
Rx(s) sent to pharmacy electronically.  

## 2018-06-25 ENCOUNTER — Ambulatory Visit: Payer: Medicare Other | Admitting: Adult Health

## 2018-07-16 ENCOUNTER — Other Ambulatory Visit: Payer: Self-pay

## 2018-07-16 ENCOUNTER — Encounter: Payer: Self-pay | Admitting: Adult Health

## 2018-07-16 ENCOUNTER — Ambulatory Visit (INDEPENDENT_AMBULATORY_CARE_PROVIDER_SITE_OTHER): Payer: Medicare Other | Admitting: Adult Health

## 2018-07-16 DIAGNOSIS — I10 Essential (primary) hypertension: Secondary | ICD-10-CM | POA: Diagnosis not present

## 2018-07-16 DIAGNOSIS — Z720 Tobacco use: Secondary | ICD-10-CM | POA: Diagnosis not present

## 2018-07-16 DIAGNOSIS — Z0000 Encounter for general adult medical examination without abnormal findings: Secondary | ICD-10-CM

## 2018-07-16 DIAGNOSIS — Z23 Encounter for immunization: Secondary | ICD-10-CM | POA: Diagnosis not present

## 2018-07-16 DIAGNOSIS — E1169 Type 2 diabetes mellitus with other specified complication: Secondary | ICD-10-CM

## 2018-07-16 DIAGNOSIS — E785 Hyperlipidemia, unspecified: Secondary | ICD-10-CM

## 2018-07-16 DIAGNOSIS — C61 Malignant neoplasm of prostate: Secondary | ICD-10-CM

## 2018-07-16 MED ORDER — VARENICLINE TARTRATE 0.5 MG X 11 & 1 MG X 42 PO MISC: ORAL | 0 refills | Status: DC

## 2018-07-16 MED ORDER — METFORMIN HCL 500 MG PO TABS: 500.0000 mg | ORAL_TABLET | Freq: Two times a day (BID) | ORAL | 1 refills | Status: DC
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
## 2018-07-16 NOTE — Patient Instructions (Signed)
It was great seeing you today   I have prescribed Chantix for you to help quit smoking. Please follow up with me in one month after starting   We will follow up with you regarding your blood work   Please work on lifestyle modifications to help you lose weight - heart healthy diet and exercise

## 2018-08-11 ENCOUNTER — Other Ambulatory Visit: Payer: Self-pay

## 2018-08-11 DIAGNOSIS — E785 Hyperlipidemia, unspecified: Secondary | ICD-10-CM

## 2018-08-11 DIAGNOSIS — E1169 Type 2 diabetes mellitus with other specified complication: Secondary | ICD-10-CM

## 2018-08-11 DIAGNOSIS — I10 Essential (primary) hypertension: Secondary | ICD-10-CM

## 2018-08-11 MED ORDER — CLOPIDOGREL BISULFATE 75 MG PO TABS: 75.0000 mg | ORAL_TABLET | Freq: Every day | ORAL | 3 refills | Status: DC

## 2018-08-24 ENCOUNTER — Other Ambulatory Visit: Payer: Self-pay | Admitting: Adult Health

## 2018-08-24 ENCOUNTER — Other Ambulatory Visit: Payer: Self-pay

## 2018-08-24 DIAGNOSIS — I10 Essential (primary) hypertension: Secondary | ICD-10-CM

## 2018-08-24 MED ORDER — ISOSORBIDE MONONITRATE ER 30 MG PO TB24: 15.0000 mg | ORAL_TABLET | Freq: Every day | ORAL | 1 refills | Status: DC

## 2019-02-01 ENCOUNTER — Other Ambulatory Visit: Payer: Self-pay | Admitting: Cardiovascular Disease

## 2019-02-17 ENCOUNTER — Other Ambulatory Visit: Payer: Self-pay | Admitting: Adult Health

## 2019-02-17 DIAGNOSIS — E1169 Type 2 diabetes mellitus with other specified complication: Secondary | ICD-10-CM

## 2019-02-19 NOTE — Telephone Encounter (Signed)
Denied.  Pt needs A1C.  Did not do lab work from [facility].

## 2019-02-25 ENCOUNTER — Other Ambulatory Visit: Payer: Self-pay | Admitting: Adult Health

## 2019-02-25 DIAGNOSIS — E1169 Type 2 diabetes mellitus with other specified complication: Secondary | ICD-10-CM

## 2019-02-25 NOTE — Telephone Encounter (Signed)
Denied. Needs appt

## 2019-07-06 ENCOUNTER — Other Ambulatory Visit: Payer: Self-pay | Admitting: Cardiovascular Disease

## 2019-07-12 ENCOUNTER — Other Ambulatory Visit: Payer: Self-pay | Admitting: Cardiovascular Disease

## 2019-07-30 ENCOUNTER — Ambulatory Visit: Payer: Medicare PPO | Attending: Internal Medicine

## 2019-07-30 DIAGNOSIS — Z23 Encounter for immunization: Secondary | ICD-10-CM

## 2019-07-30 NOTE — Progress Notes (Signed)
   Covid-19 Vaccination Clinic  Name:  Trayce Kees    MRN: [id number] DOB: October 14, 1950  07/30/2019  Mr. Watson was observed post Covid-19 immunization for 30 minutes based on pre-vaccination screening without incident. He was provided with Vaccine Information Sheet and instruction to access the V-Safe system.   Mr. Morita was instructed to call 911 with any severe reactions post vaccine: Marland Kitchen Difficulty breathing  . Swelling of face and throat  . A fast heartbeat  . A bad rash all over body  . Dizziness and weakness   Immunizations Administered    Name Date Dose VIS Date Route   Pfizer COVID-19 Vaccine 07/30/2019 11:02 AM 0.3 mL 04/16/2019 Intramuscular   Manufacturer: [employer]   Lot: [id number]   [place]: [id number]

## 2019-08-09 ENCOUNTER — Other Ambulatory Visit: Payer: Self-pay

## 2019-08-09 MED ORDER — CARVEDILOL 6.25 MG PO TABS: 6.2500 mg | ORAL_TABLET | Freq: Two times a day (BID) | ORAL | 1 refills | Status: DC

## 2019-08-09 MED ORDER — ATORVASTATIN CALCIUM 20 MG PO TABS: 20.0000 mg | ORAL_TABLET | Freq: Every day | ORAL | 1 refills | Status: DC

## 2019-08-12 ENCOUNTER — Other Ambulatory Visit: Payer: Self-pay

## 2019-08-13 ENCOUNTER — Ambulatory Visit (INDEPENDENT_AMBULATORY_CARE_PROVIDER_SITE_OTHER): Payer: Medicare PPO | Admitting: Adult Health

## 2019-08-13 ENCOUNTER — Encounter: Payer: Self-pay | Admitting: Adult Health

## 2019-08-13 VITALS — BP 106/70 | Temp 97.3°F | Ht 68.5 in | Wt 245.0 lb

## 2019-08-13 DIAGNOSIS — E785 Hyperlipidemia, unspecified: Secondary | ICD-10-CM | POA: Diagnosis not present

## 2019-08-13 DIAGNOSIS — Z0000 Encounter for general adult medical examination without abnormal findings: Secondary | ICD-10-CM

## 2019-08-13 DIAGNOSIS — E1169 Type 2 diabetes mellitus with other specified complication: Secondary | ICD-10-CM | POA: Diagnosis not present

## 2019-08-13 DIAGNOSIS — I10 Essential (primary) hypertension: Secondary | ICD-10-CM

## 2019-08-13 DIAGNOSIS — Z720 Tobacco use: Secondary | ICD-10-CM

## 2019-08-13 DIAGNOSIS — Z125 Encounter for screening for malignant neoplasm of prostate: Secondary | ICD-10-CM | POA: Diagnosis not present

## 2019-08-13 LAB — CBC WITH DIFFERENTIAL/PLATELET
Basophils Absolute: 0 10*3/uL (ref 0.0–0.1)
Basophils Relative: 0.6 % (ref 0.0–3.0)
Eosinophils Absolute: 0.1 10*3/uL (ref 0.0–0.7)
Eosinophils Relative: 3.2 % (ref 0.0–5.0)
HCT: 40.1 % (ref 39.0–52.0)
Hemoglobin: 13.7 g/dL (ref 13.0–17.0)
Lymphocytes Relative: 41.5 % (ref 12.0–46.0)
Lymphs Abs: 1.8 10*3/uL (ref 0.7–4.0)
MCHC: 34.1 g/dL (ref 30.0–36.0)
MCV: 90.9 fl (ref 78.0–100.0)
Monocytes Absolute: 0.4 10*3/uL (ref 0.1–1.0)
Monocytes Relative: 10 % (ref 3.0–12.0)
Neutro Abs: 1.9 10*3/uL (ref 1.4–7.7)
Neutrophils Relative %: 44.7 % (ref 43.0–77.0)
Platelets: 255 10*3/uL (ref 150.0–400.0)
RBC: 4.41 Mil/uL (ref 4.22–5.81)
RDW: 13.8 % (ref 11.5–15.5)
WBC: 4.3 10*3/uL (ref 4.0–10.5)

## 2019-08-13 LAB — COMPREHENSIVE METABOLIC PANEL
ALT: 16 U/L (ref 0–53)
AST: 13 U/L (ref 0–37)
Albumin: 4.3 g/dL (ref 3.5–5.2)
Alkaline Phosphatase: 58 U/L (ref 39–117)
BUN: 14 mg/dL (ref 6–23)
CO2: 29 mEq/L (ref 19–32)
Calcium: 9.8 mg/dL (ref 8.4–10.5)
Chloride: 103 mEq/L (ref 96–112)
Creatinine, Ser: 0.98 mg/dL (ref 0.40–1.50)
GFR: 92 mL/min (ref >60.00)
Glucose, Bld: 91 mg/dL (ref 70–99)
Potassium: 4.7 mEq/L (ref 3.5–5.1)
Sodium: 139 mEq/L (ref 135–145)
Total Bilirubin: 0.7 mg/dL (ref 0.2–1.2)
Total Protein: 6.9 g/dL (ref 6.0–8.3)

## 2019-08-13 LAB — LIPID PANEL
Cholesterol: 166 mg/dL (ref 0–200)
HDL: 26.2 mg/dL — ABNORMAL LOW (ref >39.00)
LDL Cholesterol: 101 mg/dL — ABNORMAL HIGH (ref 0–99)
NonHDL: 139.9
Total CHOL/HDL Ratio: 6
Triglycerides: 195 mg/dL — ABNORMAL HIGH (ref 0.0–149.0)
VLDL: 39 mg/dL (ref 0.0–40.0)

## 2019-08-13 LAB — TSH: TSH: 1.5 u[IU]/mL (ref 0.35–4.50)

## 2019-08-13 LAB — PSA: PSA: 0.32 ng/mL (ref 0.10–4.00)

## 2019-08-13 LAB — HEMOGLOBIN A1C: Hgb A1c MFr Bld: 7.1 % — ABNORMAL HIGH (ref 4.6–6.5)

## 2019-08-13 MED ORDER — METFORMIN HCL 500 MG PO TABS: 500.0000 mg | ORAL_TABLET | Freq: Two times a day (BID) | ORAL | 1 refills | Status: DC
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
## 2019-08-13 NOTE — Patient Instructions (Addendum)
It was great seeing you today   We will follow up with you regarding your labs   Health Maintenance, Male A healthy lifestyle and preventative care can promote health and wellness.  Maintain regular health, dental, and eye exams.  Eat a healthy diet. Foods like vegetables, fruits, whole grains, low-fat dairy products, and lean protein foods contain the nutrients you need and are low in calories. Decrease your intake of foods high in solid fats, added sugars, and salt. Get information about a proper diet from your health care provider, if necessary.  Regular physical exercise is one of the most important things you can do for your health. Most adults should get at least 150 minutes of moderate-intensity exercise (any activity that increases your heart rate and causes you to sweat) each week. In addition, most adults need muscle-strengthening exercises on 2 or more days a week.   Maintain a healthy weight. The body mass index (BMI) is a screening tool to identify possible weight problems. It provides an estimate of body fat based on height and weight. Your health care provider can find your BMI and can help you achieve or maintain a healthy weight. For males 20 years and older:  A BMI below 18.5 is considered underweight.  A BMI of 18.5 to 24.9 is normal.  A BMI of 25 to 29.9 is considered overweight.  A BMI of 30 and above is considered obese.  Maintain normal blood lipids and cholesterol by exercising and minimizing your intake of saturated fat. Eat a balanced diet with plenty of fruits and vegetables. Blood tests for lipids and cholesterol should begin at age 89 and be repeated every 5 years. If your lipid or cholesterol levels are high, you are over age 84, or you are at high risk for heart disease, you may need your cholesterol levels checked more frequently.Ongoing high lipid and cholesterol levels should be treated with medicines if diet and exercise are not working.  If you smoke,  find out from your health care provider how to quit. If you do not use tobacco, do not start.  Lung cancer screening is recommended for adults aged 28-80 years who are at high risk for developing lung cancer because of a history of smoking. A yearly low-dose CT scan of the lungs is recommended for people who have at least a 30-pack-year history of smoking and are current smokers or have quit within the past 15 years. A pack year of smoking is smoking an average of 1 pack of cigarettes a day for 1 year (for example, a 30-pack-year history of smoking could mean smoking 1 pack a day for 30 years or 2 packs a day for 15 years). Yearly screening should continue until the smoker has stopped smoking for at least 15 years. Yearly screening should be stopped for people who develop a health problem that would prevent them from having lung cancer treatment.  If you choose to drink alcohol, do not have more than 2 drinks per day. One drink is considered to be 12 oz (360 mL) of beer, 5 oz (150 mL) of wine, or 1.5 oz (45 mL) of liquor.  Avoid the use of street drugs. Do not share needles with anyone. Ask for help if you need support or instructions about stopping the use of drugs.  High blood pressure causes heart disease and increases the risk of stroke. High blood pressure is more likely to develop in:  People who have blood pressure in the end of the  normal range (100-139/85-89 mm Hg).  People who are overweight or obese.  People who are African American.  If you are 72-60 years of age, have your blood pressure checked every 3-5 years. If you are 78 years of age or older, have your blood pressure checked every year. You should have your blood pressure measured twice--once when you are at a hospital or clinic, and once when you are not at a hospital or clinic. Record the average of the two measurements. To check your blood pressure when you are not at a hospital or clinic, you can use:  An automated blood  pressure machine at a pharmacy.  A home blood pressure monitor.  If you are 11-6 years old, ask your health care provider if you should take aspirin to prevent heart disease.  Diabetes screening involves taking a blood sample to check your fasting blood sugar level. This should be done once every 3 years after age 56 if you are at a normal weight and without risk factors for diabetes. Testing should be considered at a younger age or be carried out more frequently if you are overweight and have at least 1 risk factor for diabetes.  Colorectal cancer can be detected and often prevented. Most routine colorectal cancer screening begins at the age of 46 and continues through age 85. However, your health care provider may recommend screening at an earlier age if you have risk factors for colon cancer. On a yearly basis, your health care provider may provide home test kits to check for hidden blood in the stool. A small camera at the end of a tube may be used to directly examine the colon (sigmoidoscopy or colonoscopy) to detect the earliest forms of colorectal cancer. Talk to your health care provider about this at age 61 when routine screening begins. A direct exam of the colon should be repeated every 5-10 years through age 52, unless early forms of precancerous polyps or small growths are found.  People who are at an increased risk for hepatitis B should be screened for this virus. You are considered at high risk for hepatitis B if:  You were born in a country where hepatitis B occurs often. Talk with your health care provider about which countries are considered high risk.  Your parents were born in a high-risk country and you have not received a shot to protect against hepatitis B (hepatitis B vaccine).  You have HIV or AIDS.  You use needles to inject street drugs.  You live with, or have sex with, someone who has hepatitis B.  You are a man who has sex with other men (MSM).  You get  hemodialysis treatment.  You take certain medicines for conditions like cancer, organ transplantation, and autoimmune conditions.  Hepatitis C blood testing is recommended for all people born from [birth year] through 1965 and any individual with known risk factors for hepatitis C.  Healthy men should no longer receive prostate-specific antigen (PSA) blood tests as part of routine cancer screening. Talk to your health care provider about prostate cancer screening.  Testicular cancer screening is not recommended for adolescents or adult males who have no symptoms. Screening includes self-exam, a health care provider exam, and other screening tests. Consult with your health care provider about any symptoms you have or any concerns you have about testicular cancer.  Practice safe sex. Use condoms and avoid high-risk sexual practices to reduce the spread of sexually transmitted infections (STIs).  You should be screened for  STIs, including gonorrhea and chlamydia if:  You are sexually active and are younger than 24 years.  You are older than 24 years, and your health care provider tells you that you are at risk for this type of infection.  Your sexual activity has changed since you were last screened, and you are at an increased risk for chlamydia or gonorrhea. Ask your health care provider if you are at risk.  If you are at risk of being infected with HIV, it is recommended that you take a prescription medicine daily to prevent HIV infection. This is called pre-exposure prophylaxis (PrEP). You are considered at risk if:  You are a man who has sex with other men (MSM).  You are a heterosexual man who is sexually active with multiple partners.  You take drugs by injection.  You are sexually active with a partner who has HIV.  Talk with your health care provider about whether you are at high risk of being infected with HIV. If you choose to begin PrEP, you should first be tested for HIV. You should  then be tested every 3 months for as long as you are taking PrEP.  Use sunscreen. Apply sunscreen liberally and repeatedly throughout the day. You should seek shade when your shadow is shorter than you. Protect yourself by wearing long sleeves, pants, a wide-brimmed hat, and sunglasses year round whenever you are outdoors.  Tell your health care provider of new moles or changes in moles, especially if there is a change in shape or color. Also, tell your health care provider if a mole is larger than the size of a pencil eraser.  A one-time screening for abdominal aortic aneurysm (AAA) and surgical repair of large AAAs by ultrasound is recommended for men aged 44-75 years who are current or former smokers.  Stay current with your vaccines (immunizations).   This information is not intended to replace advice given to you by your health care provider. Make sure you discuss any questions you have with your health care provider.   Document Released: 10/19/2007 Document Revised: 05/13/2014 Document Reviewed: 09/17/2010 Elsevier Interactive Patient Education [employer].

## 2019-08-24 ENCOUNTER — Ambulatory Visit: Payer: Medicare PPO | Attending: Internal Medicine

## 2019-08-24 DIAGNOSIS — Z23 Encounter for immunization: Secondary | ICD-10-CM

## 2019-08-24 NOTE — Progress Notes (Signed)
   Covid-19 Vaccination Clinic  Name:  Kyle Ballard    MRN: [id number] DOB: May 01, 1951  08/24/2019  [name] was observed post Covid-19 immunization for 30 minutes based on pre-vaccination screening without incident. He was provided with Vaccine Information Sheet and instruction to access the V-Safe system.   [name] was instructed to call 911 with any severe reactions post vaccine: Marland Kitchen Difficulty breathing  . Swelling of face and throat  . A fast heartbeat  . A bad rash all over body  . Dizziness and weakness   Immunizations Administered    Name Date Dose VIS Date Route   Pfizer COVID-19 Vaccine 08/24/2019 10:37 AM 0.3 mL 06/30/2018 Intramuscular   Manufacturer: [place]   Lot: [id number]   [place]: [id number]

## 2019-09-03 ENCOUNTER — Encounter: Payer: Self-pay | Admitting: Cardiovascular Disease

## 2019-09-03 ENCOUNTER — Other Ambulatory Visit: Payer: Self-pay

## 2019-09-03 ENCOUNTER — Ambulatory Visit: Payer: Medicare PPO | Admitting: Cardiovascular Disease

## 2019-09-03 DIAGNOSIS — I10 Essential (primary) hypertension: Secondary | ICD-10-CM

## 2019-09-03 DIAGNOSIS — I4510 Unspecified right bundle-branch block: Secondary | ICD-10-CM | POA: Insufficient documentation

## 2019-09-03 DIAGNOSIS — I200 Unstable angina: Secondary | ICD-10-CM

## 2019-09-03 DIAGNOSIS — E1169 Type 2 diabetes mellitus with other specified complication: Secondary | ICD-10-CM | POA: Diagnosis not present

## 2019-09-03 DIAGNOSIS — Z720 Tobacco use: Secondary | ICD-10-CM | POA: Diagnosis not present

## 2019-09-03 DIAGNOSIS — E785 Hyperlipidemia, unspecified: Secondary | ICD-10-CM

## 2019-09-03 MED ORDER — ATORVASTATIN CALCIUM 40 MG PO TABS: 40.0000 mg | ORAL_TABLET | Freq: Every day | ORAL | 1 refills | Status: DC

## 2019-09-03 NOTE — Patient Instructions (Signed)
Medication Instructions:  Increase Lipitor to 40 mg daily   *If you need a refill on your cardiac medications before your next appointment, please call your pharmacy*   Lab Work: LIPID and LIVER in 2 months.  Attached are the lab orders that are needed before your upcoming appointment, please come in 2 months, anytime to have your labs drawn.   They are fasting labs, so nothing to eat or drink after midnight.  Lab hours: 8:00-4:00 lunch hours 12:45-1:45  If you have labs (blood work) drawn today and your tests are completely normal, you will receive your results only by: Marland Kitchen MyChart Message (if you have MyChart) OR . A paper copy in the mail If you have any lab test that is abnormal or we need to change your treatment, we will call you to review the results.    Follow-Up: At [facility], you and your health needs are our priority.  As part of our continuing mission to provide you with exceptional heart care, we have created designated Provider Care Teams.  These Care Teams include your primary Cardiologist (physician) and Advanced Practice Providers (APPs -  Physician Assistants and Nurse Practitioners) who all work together to provide you with the care you need, when you need it.  We recommend signing up for the patient portal called "MyChart".  Sign up information is provided on this After Visit Summary.  MyChart is used to connect with patients for Virtual Visits (Telemedicine).  Patients are able to view lab/test results, encounter notes, upcoming appointments, etc.  Non-urgent messages can be sent to your provider as well.   To learn more about what you can do with MyChart, go to [email or web address].    Your next appointment:   12 month(s)  The format for your next appointment:   In Person  Provider:   Quay Burow, MD

## 2019-09-03 NOTE — Assessment & Plan Note (Signed)
History of hyperlipidemia on atorvastatin 20 mg a day with lipid profile performed 08/13/2019 revealing total cholesterol 166, LDL 101 and HDL 26.  I am going to increase his atorvastatin dose from 20 to 40 mg a day and we will recheck a lipid liver profile in 2 months

## 2019-09-03 NOTE — Progress Notes (Signed)
09/03/2019 Kyle Ballard [facility]   15-May-1950  [id number]  Primary Physician Kyle Ballard, [name], NP Primary Cardiologist: Lorretta Harp MD Lupe Carney, [place]  HPI:  Kyle Ballard is a 69 y.o.  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  H I last saw him in the office 05/01/2018.  E has a history of treated hypertension, diabetes and hyperlipidemia.  He smoked for many years but is currently trying to quit.  He had cardiac catheterization performed October 2015 revealing moderate nonobstructive CAD.  Medical therapy was recommended.  Because of accelerated angina he was admitted to the hospital 04/10/2018 and underwent diagnostic cath by Dr. Martinique revealing moderate branch vessel disease with obstructive lesions lesions and vessels too small for PCI.  His medicines were adjusted, long acting nitrate was added and medical therapy was recommended.  Since discharge his angina has markedly improved.   Since I saw him a year and a half ago he continues to do well.  He is trying to stop smoking and has smoked off and on since that time now quit for 5 weeks.  He denies chest pain or shortness of breath.   Current Meds  Medication Sig  . aspirin EC 81 MG EC tablet Take 1 tablet (81 mg total) by mouth daily.  Marland Kitchen atorvastatin (LIPITOR) 40 MG tablet Take 1 tablet (40 mg total) by mouth daily at 6 PM.  . carvedilol (COREG) 6.25 MG tablet Take 1 tablet (6.25 mg total) by mouth 2 (two) times daily with a meal.  . CINNAMON PO Take 2 tablets by mouth 2 (two) times daily.  . clopidogrel (PLAVIX) 75 MG tablet Take 1 tablet (75 mg total) by mouth daily.  . isosorbide mononitrate (IMDUR) 30 MG 24 hr tablet Take 1/2 (one-half) tablet by mouth once daily  . losartan (COZAAR) 100 MG tablet Take 1 tablet by mouth once daily  . metFORMIN (GLUCOPHAGE) 500 MG tablet Take 1 tablet (500 mg total) by  mouth 2 (two) times daily with a meal.  . NITROSTAT 0.4 MG SL tablet DISSOLVE ONE TABLET UNDER THE TONGUE EVERY 5 MINUTES AS NEEDED FOR CHEST PAIN. (Patient taking differently: Place 0.4 mg under the tongue every 5 (five) minutes as needed for chest pain. )  . Omega-3 Fatty Acids (FISH OIL PO) Take 2 capsules by mouth daily at 2 PM.  . [DISCONTINUED] atorvastatin (LIPITOR) 20 MG tablet Take 1 tablet (20 mg total) by mouth daily at 6 PM.     Allergies  Allergen Reactions  . Lisinopril Swelling  . Benadryl [Diphenhydramine Hcl] Other (See Comments)    Not really sure if has allergy; but past concern may have caused lip swelling     Social History   Socioeconomic History  . Marital status: Married    Spouse name: Not on file  . Number of children: 2  . Years of education: Not on file  . Highest education level: Not on file  Occupational History  . Occupation: retired  Tobacco Use  . Smoking status: Current Some Day Smoker    Packs/day: 0.25    Types: Cigarettes  . Smokeless tobacco: Never Used  Substance and Sexual Activity  . Alcohol use: No    Alcohol/week: 0.0 standard drinks  . Drug use: No  . Sexual activity: Not on file  Other Topics Concern  . Not on file  Social  History Narrative   Works at [employer]; His wife is on custodial staff at [employer]; Son in [place]   Social Determinants of Health   Financial Resource Strain:   . Difficulty of Paying Living Expenses:   Food Insecurity:   . Worried About Charity fundraiser in the Last Year:   . Arboriculturist in the Last Year:   Transportation Needs:   . Film/video editor (Medical):   Marland Kitchen Lack of Transportation (Non-Medical):   Physical Activity:   . Days of Exercise per Week:   . Minutes of Exercise per Session:   Stress:   . Feeling of Stress :   Social Connections:   . Frequency of Communication with Friends and Family:   . Frequency of Social Gatherings with Friends and Family:   . Attends Religious Services:    . Active Member of Clubs or Organizations:   . Attends Archivist Meetings:   Marland Kitchen Marital Status:   Intimate Partner Violence:   . Fear of Current or Ex-Partner:   . Emotionally Abused:   Marland Kitchen Physically Abused:   . Sexually Abused:      Review of Systems: General: negative for chills, fever, night sweats or weight changes.  Cardiovascular: negative for chest pain, dyspnea on exertion, edema, orthopnea, palpitations, paroxysmal nocturnal dyspnea or shortness of breath Dermatological: negative for rash Respiratory: negative for cough or wheezing Urologic: negative for hematuria Abdominal: negative for nausea, vomiting, diarrhea, bright red blood per rectum, melena, or hematemesis Neurologic: negative for visual changes, syncope, or dizziness All other systems reviewed and are otherwise negative except as noted above.    Blood pressure 138/74, pulse (!) 59, height 5\' 9"  (1.753 m), weight 242 lb 12.8 oz (110.1 kg), SpO2 99 %.  General appearance: alert and no distress Neck: no adenopathy, no carotid bruit, no JVD, supple, symmetrical, trachea midline and thyroid not enlarged, symmetric, no tenderness/mass/nodules Lungs: clear to auscultation bilaterally Heart: regular rate and rhythm, S1, S2 normal, no murmur, click, rub or gallop Extremities: extremities normal, atraumatic, no cyanosis or edema Pulses: 2+ and symmetric Skin: Skin color, texture, turgor normal. No rashes or lesions Neurologic: Alert and oriented X 3, normal strength and tone. Normal symmetric reflexes. Normal coordination and gait  EKG sinus bradycardia 59 with right bundle branch block and left anterior fascicular block (bifascicular block).  This is a new finding compared to his previous EKGs.  I personally reviewed this EKG.  ASSESSMENT AND PLAN:   Hypertension History of essential hypertension with blood pressure measured today at 138/74.  He is on carvedilol and losartan.  Hyperlipidemia associated  with type 2 diabetes mellitus ([place]) History of hyperlipidemia on atorvastatin 20 mg a day with lipid profile performed 08/13/2019 revealing total cholesterol 166, LDL 101 and HDL 26.  I am going to increase his atorvastatin dose from 20 to 40 mg a day and we will recheck a lipid liver profile in 2 months  Tobacco abuse Mr. Hartsock has stopped smoking 5 weeks ago although he has been off and on smoking since I saw him a year and a half ago.  He is committed to stopping however.  Unstable angina (HCC) History of branch vessel CAD by cardiac cath performed by Dr. Martinique 04/10/2018 and vessels too small for PCI.  His medicines were adjusted.  Is on a long-acting nitrate.  He denies chest pain.  Right bundle branch block New since prior EKG      Pearletha Forge.  Gwenlyn Found MD [facility], [facility] 09/03/2019 11:22 AM

## 2019-09-03 NOTE — Assessment & Plan Note (Signed)
New since prior EKG

## 2019-09-03 NOTE — Assessment & Plan Note (Signed)
History of essential hypertension with blood pressure measured today at 138/74.  He is on carvedilol and losartan.

## 2019-09-03 NOTE — Assessment & Plan Note (Signed)
History of branch vessel CAD by cardiac cath performed by Dr. Martinique 04/10/2018 and vessels too small for PCI.  His medicines were adjusted.  Is on a long-acting nitrate.  He denies chest pain.

## 2019-09-03 NOTE — Assessment & Plan Note (Signed)
Kyle Ballard has stopped smoking 5 weeks ago although he has been off and on smoking since I saw him a year and a half ago.  He is committed to stopping however.

## 2019-10-25 ENCOUNTER — Other Ambulatory Visit: Payer: Self-pay | Admitting: Cardiovascular Disease

## 2019-10-25 DIAGNOSIS — I10 Essential (primary) hypertension: Secondary | ICD-10-CM

## 2019-10-25 DIAGNOSIS — E1169 Type 2 diabetes mellitus with other specified complication: Secondary | ICD-10-CM

## 2019-11-01 ENCOUNTER — Other Ambulatory Visit: Payer: Self-pay | Admitting: Adult Health

## 2019-11-01 DIAGNOSIS — I10 Essential (primary) hypertension: Secondary | ICD-10-CM

## 2019-12-10 LAB — LIPID PANEL
Chol/HDL Ratio: 6.2 ratio — ABNORMAL HIGH (ref 0.0–5.0)
Cholesterol, Total: 124 mg/dL (ref 100–199)
HDL: 20 mg/dL — ABNORMAL LOW (ref >39)
LDL Chol Calc (NIH): 46 mg/dL (ref 0–99)
Triglycerides: 386 mg/dL — ABNORMAL HIGH (ref 0–149)
VLDL Cholesterol Cal: 58 mg/dL — ABNORMAL HIGH (ref 5–40)

## 2019-12-10 LAB — HEPATIC FUNCTION PANEL
ALT: 17 IU/L (ref 0–44)
AST: 18 IU/L (ref 0–40)
Albumin: 4.7 g/dL (ref 3.8–4.8)
Alkaline Phosphatase: 81 IU/L (ref 48–121)
Bilirubin Total: 0.5 mg/dL (ref 0.0–1.2)
Bilirubin, Direct: 0.42 mg/dL — ABNORMAL HIGH (ref 0.00–0.40)
Total Protein: 7.8 g/dL (ref 6.0–8.5)

## 2019-12-14 ENCOUNTER — Telehealth: Payer: Self-pay | Admitting: Cardiovascular Disease

## 2019-12-14 MED ORDER — FENOFIBRATE 145 MG PO TABS
145.0000 mg | ORAL_TABLET | Freq: Every day | ORAL | 5 refills | Status: DC
Start: 2019-12-14 — End: 2020-07-27

## 2019-12-14 NOTE — Telephone Encounter (Signed)
Lorretta Harp, MD  12/12/2019 9:43 AM EDT     Excellent FLP except for elev Trig. Add Fenofibrate 145 mg and recheck 2 months   Patient states he was not fasting for recent labs.  He is OK with starting new med - Rx(s) sent to pharmacy electronically. Lipid panel ordered

## 2019-12-14 NOTE — Telephone Encounter (Signed)
    Pt is returning call from Cheryl about lab results 

## 2019-12-23 ENCOUNTER — Telehealth: Payer: Self-pay | Admitting: Adult Health

## 2019-12-23 NOTE — Telephone Encounter (Signed)
Noted. FYI 

## 2019-12-23 NOTE — Telephone Encounter (Signed)
FYI  Pts spouse is calling in stating that she would like for [place] to keep a eye out for her FMLA form for the pt.

## 2019-12-27 NOTE — Telephone Encounter (Signed)
Kyle Ballard (on [place]) called to see if we have the FMLA paperwork. Informed her that his PCP does not work Mondays but will have CMA reach out to let her know if we do or don't   Checked folder up here did not see it   [phone]

## 2019-12-28 NOTE — Telephone Encounter (Signed)
Paperwork was given to spouse's pcp. Paperwork completed & faxed in. [name] for pt's spouse letting her know.

## 2019-12-28 NOTE — Telephone Encounter (Signed)
Pts spouse called in wanting to know it the form was completed for the pt and not for her.  Spouse would like to have a copy for her record and will be by to pick it up today.

## 2019-12-29 NOTE — Telephone Encounter (Signed)
Rip Harbour (pt's spouse) stated that the FMLA paperwork needs to be filled out by her husband's PCP [name] not her PCP which is Martinique. She needs this refilled out by [facility] and re-faxed to [phone]  Spouse stated this is due by Aug 28th and she would like a copy she can be reached at [phone]  Form was filled out in pencil-so should be away to erase and fill correct info out   Placed form in Red folder

## 2019-12-30 NOTE — Telephone Encounter (Signed)
Please advise 

## 2019-12-30 NOTE — Telephone Encounter (Signed)
The copy I was given was not done in pencil. I crossed out Dr. Morrison Old name and signature and provided mine. We can try sending that in

## 2019-12-31 NOTE — Telephone Encounter (Signed)
PPW has been faxed  

## 2020-01-28 ENCOUNTER — Telehealth: Payer: Self-pay | Admitting: Cardiovascular Disease

## 2020-01-28 NOTE — Telephone Encounter (Signed)
Returned the call to the patient. He stated that he recently bought a smart watch that is able to do an EKG reading. The watch has been telling him that he has been off and on again in atrial fibrillation. He also stated that at times that his watch is saying he had has heart rates in the 30's but he is unsure if this is accurate. His normal heart rate runs in the 60-70's. He is currently asymptomatic, denying chest pain, shortness of breath, and dizziness.  He is currently taking Carvedilol 6.25 mg twice daily, Imdur 15 mg once daily, Losartan 100 mg once daily and Aspirin 81 mg daily.   He has also quit smoking as of a few months ago.   He has an appointment on 9/29 with Dr. Gwenlyn Found but has been advised to call the on call provider if he needs anything further over the weekend or proceed to the ED if he develops chest pain.

## 2020-01-28 NOTE — Telephone Encounter (Signed)
Kyle Ballard has requested an appointment via pt schedule to be seen for irregular heartbeat. He has been scheduled to come in on 09/29/21at 1:30 PM. Please advise.

## 2020-02-02 ENCOUNTER — Encounter: Payer: Self-pay | Admitting: Cardiovascular Disease

## 2020-02-02 ENCOUNTER — Ambulatory Visit (INDEPENDENT_AMBULATORY_CARE_PROVIDER_SITE_OTHER): Payer: Medicare PPO | Admitting: Cardiovascular Disease

## 2020-02-02 ENCOUNTER — Other Ambulatory Visit: Payer: Self-pay

## 2020-02-02 ENCOUNTER — Encounter: Payer: Self-pay | Admitting: *Deleted

## 2020-02-02 VITALS — BP 138/82 | HR 59 | Temp 95.0°F | Ht 69.0 in | Wt 243.0 lb

## 2020-02-02 DIAGNOSIS — Z720 Tobacco use: Secondary | ICD-10-CM

## 2020-02-02 DIAGNOSIS — I200 Unstable angina: Secondary | ICD-10-CM | POA: Diagnosis not present

## 2020-02-02 DIAGNOSIS — I10 Essential (primary) hypertension: Secondary | ICD-10-CM | POA: Diagnosis not present

## 2020-02-02 DIAGNOSIS — I4891 Unspecified atrial fibrillation: Secondary | ICD-10-CM | POA: Diagnosis not present

## 2020-02-02 DIAGNOSIS — I499 Cardiac arrhythmia, unspecified: Secondary | ICD-10-CM

## 2020-02-02 DIAGNOSIS — I4510 Unspecified right bundle-branch block: Secondary | ICD-10-CM

## 2020-02-02 NOTE — Assessment & Plan Note (Signed)
2D echocardiogram performed 04/10/2018 revealed normal LV systolic function with grade 1 diastolic dysfunction.  He is completely asymptomatic.

## 2020-02-02 NOTE — Assessment & Plan Note (Signed)
History of essential hypertension with blood pressure measured today 138/82.  He is on carvedilol and losartan.

## 2020-02-02 NOTE — Progress Notes (Signed)
02/02/2020 Kyle Ballard [facility]   1950/08/21  [id number]  Primary Physician Kyle Ballard, Kyle Rumps, NP Primary Cardiologist: Kyle Harp MD Kyle Ballard, [place]  HPI:  Kyle Ballard is a 69 y.o. moderately overweight married African-American male father of 2, grandfather of one grandchild who is retired from working [employer] in [place]. H I last saw him in the office 09/03/2019.  E has a history of treated hypertension, diabetes and hyperlipidemia. He smoked for many years but is currently trying to quit. He had cardiac catheterization performed October 2015 revealing moderate nonobstructive CAD. Medical therapy was recommended. Because of accelerated angina he was admitted to the hospital 04/10/2018 and underwent diagnostic cath by Dr. Martinique revealing moderate branch vessel disease with obstructive lesions lesions and vessels too small for PCI. His medicines were adjusted, long acting nitrate was added and medical therapy was recommended.   Since I saw him 6 months ago he continues to do well.  He has no further chest pain.  He has stopped smoking since I last spoke to him.  He is scheduled to have blood work performed in the near future after his atorvastatin dose was increased from 20 to 60 mg a day.  He also has noticed some irregular heartbeat on his apple watch and blood pressure monitor and as a result we are going to get a Zio patch to further evaluate.  Current Meds  Medication Sig   aspirin EC 81 MG EC tablet Take 1 tablet (81 mg total) by mouth daily.   atorvastatin (LIPITOR) 40 MG tablet Take 1 tablet (40 mg total) by mouth daily at 6 PM.   carvedilol (COREG) 6.25 MG tablet TAKE 1 TABLET BY MOUTH TWICE DAILY WITH A MEAL   CINNAMON PO Take 2 tablets by mouth 2 (two) times daily.   clopidogrel (PLAVIX) 75 MG tablet Take 1 tablet by mouth once daily   fenofibrate (TRICOR) 145 MG tablet Take 1 tablet (145 mg total) by mouth daily.   isosorbide  mononitrate (IMDUR) 30 MG 24 hr tablet Take 1/2 (one-half) tablet by mouth once daily   losartan (COZAAR) 100 MG tablet Take 1 tablet by mouth once daily   NITROSTAT 0.4 MG SL tablet DISSOLVE ONE TABLET UNDER THE TONGUE EVERY 5 MINUTES AS NEEDED FOR CHEST PAIN. (Patient taking differently: Place 0.4 mg under the tongue every 5 (five) minutes as needed for chest pain. )   [DISCONTINUED] Omega-3 Fatty Acids (FISH OIL PO) Take 2 capsules by mouth daily at 2 PM.     Allergies  Allergen Reactions   Lisinopril Swelling   Benadryl [Diphenhydramine Hcl] Other (See Comments)    Not really sure if has allergy; but past concern may have caused lip swelling     Social History   Socioeconomic History   Marital status: Married    Spouse name: Not on file   Number of children: 2   Years of education: Not on file   Highest education level: Not on file  Occupational History   Occupation: retired  Tobacco Use   Smoking status: Current Some Day Smoker    Packs/day: 0.25    Types: Cigarettes   Smokeless tobacco: Never Used  Substance and Sexual Activity   Alcohol use: No    Alcohol/week: 0.0 standard drinks   Drug use: No   Sexual activity: Not on file  Other Topics Concern   Not on file  Social History Narrative   Works at [employer];  His wife is on custodial staff at [employer]; Son in [place]   Social Determinants of Health   Financial Resource Strain:    Difficulty of Paying Living Expenses: Not on file  Food Insecurity:    Worried About Charity fundraiser in the Last Year: Not on file   [employer] of Food in the Last Year: Not on file  Transportation Needs:    Lack of Transportation (Medical): Not on file   Lack of Transportation (Non-Medical): Not on file  Physical Activity:    Days of Exercise per Week: Not on file   Minutes of Exercise per Session: Not on file  Stress:    Feeling of Stress : Not on file  Social Connections:    Frequency of Communication with  Friends and Family: Not on file   Frequency of Social Gatherings with Friends and Family: Not on file   Attends Religious Services: Not on file   Active Member of Clubs or Organizations: Not on file   Attends Archivist Meetings: Not on file   Marital Status: Not on file  Intimate Partner Violence:    Fear of Current or Ex-Partner: Not on file   Emotionally Abused: Not on file   Physically Abused: Not on file   Sexually Abused: Not on file     Review of Systems: General: negative for chills, fever, night sweats or weight changes.  Cardiovascular: negative for chest pain, dyspnea on exertion, edema, orthopnea, palpitations, paroxysmal nocturnal dyspnea or shortness of breath Dermatological: negative for rash Respiratory: negative for cough or wheezing Urologic: negative for hematuria Abdominal: negative for nausea, vomiting, diarrhea, bright red blood per rectum, melena, or hematemesis Neurologic: negative for visual changes, syncope, or dizziness All other systems reviewed and are otherwise negative except as noted above.    Blood pressure 138/82, pulse (!) 59, temperature (!) 95 F (35 C), height 5\' 9"  (1.753 m), weight 243 lb (110.2 kg).  General appearance: alert and no distress Neck: no adenopathy, no carotid bruit, no JVD, supple, symmetrical, trachea midline and thyroid not enlarged, symmetric, no tenderness/mass/nodules Lungs: clear to auscultation bilaterally Heart: regular rate and rhythm, S1, S2 normal, no murmur, click, rub or gallop Extremities: extremities normal, atraumatic, no cyanosis or edema Pulses: 2+ and symmetric Skin: Skin color, texture, turgor normal. No rashes or lesions Neurologic: Alert and oriented X 3, normal strength and tone. Normal symmetric reflexes. Normal coordination and gait  EKG sinus bradycardia 59 with right bundle branch block and left axis deviation.  I personally reviewed this EKG.  ASSESSMENT AND PLAN:    Hypertension History of essential hypertension with blood pressure measured today 138/82.  He is on carvedilol and losartan.  Acute combined systolic and diastolic CHF, NYHA class 1 (HCC) 2D echocardiogram performed 04/10/2018 revealed normal LV systolic function with grade 1 diastolic dysfunction.  He is completely asymptomatic.  Hyperlipidemia associated with type 2 diabetes mellitus ([place]) History of hyperlipidemia on increased dose of atorvastatin.  He scheduled to have blood work done in the next several days.  Tobacco abuse Discontinue tobacco abuse  Unstable angina (HCC) History of CAD status post cardiac catheterization by Dr. Martinique 04/10/2018 revealing branch vessel disease in vessel too small for PCI.  Medical therapy was recommended.  He is on beta-blocker and long-acting nitrates and has no chest pain..  Right bundle branch block Chronic  Irregular heart rate He has noticed an irregular heart rate on his apple watch and also his blood pressure monitor  although he is unaware of any palpitations.  I am going to get a 2-week Zio patch to rule out A. fib.      Kyle Harp MD FACP,FACC,FAHA, FSCAI 02/02/2020 1:40 PM

## 2020-02-02 NOTE — Assessment & Plan Note (Signed)
He has noticed an irregular heart rate on his apple watch and also his blood pressure monitor although he is unaware of any palpitations.  I am going to get a 2-week Zio patch to rule out A. fib.

## 2020-02-02 NOTE — Patient Instructions (Signed)
Medication Instructions:  The current medical regimen is effective;  continue present plan and medications.  *If you need a refill on your cardiac medications before your next appointment, please call your pharmacy*   Testing/Procedures: Your physician has recommended that you wear a 14 DAY ZIO-PATCH monitor. The Zio patch cardiac monitor continuously records heart rhythm data, this is for patients being evaluated for multiple types heart rhythms. For the first 24 hours post application, please avoid getting the Zio monitor wet in the shower or by excessive sweating during exercise. After that, feel free to carry on with regular activities. Keep soaps and lotions away from the ZIO XT Patch.  Someone will be calling you to let you know when this is mailed.  This will be mailed to you, please expect 7-10 days to receive.      Call [facility] at [phone] if you have questions regarding your ZIO XT patch monitor.  Call them immediately if you see an orange light blinking on your monitor.   If your monitor falls off in less than 4 days contact our Monitor department at [phone].  If your monitor becomes loose or falls off after 4 days call [employer] at [phone] for suggestions on securing your monitor     Follow-Up: At [facility], you and your health needs are our priority.  As part of our continuing mission to provide you with exceptional heart care, we have created designated Provider Care Teams.  These Care Teams include your primary Cardiologist (physician) and Advanced Practice Providers (APPs -  Physician Assistants and Nurse Practitioners) who all work together to provide you with the care you need, when you need it.  We recommend signing up for the patient portal called "MyChart".  Sign up information is provided on this After Visit Summary.  MyChart is used to connect with patients for Virtual Visits (Telemedicine).  Patients are able to view  lab/test results, encounter notes, upcoming appointments, etc.  Non-urgent messages can be sent to your provider as well.   To learn more about what you can do with MyChart, go to [email or web address].    Your next appointment:   Follow up with APP (PA, NP) in 6 months Follow up with Dr.Berry in 12 months.

## 2020-02-02 NOTE — Assessment & Plan Note (Signed)
History of hyperlipidemia on increased dose of atorvastatin.  He scheduled to have blood work done in the next several days.

## 2020-02-02 NOTE — Assessment & Plan Note (Signed)
Chronic. 

## 2020-02-02 NOTE — Assessment & Plan Note (Signed)
History of CAD status post cardiac catheterization by Dr. Martinique 04/10/2018 revealing branch vessel disease in vessel too small for PCI.  Medical therapy was recommended.  He is on beta-blocker and long-acting nitrates and has no chest pain.Marland Kitchen

## 2020-02-02 NOTE — Assessment & Plan Note (Signed)
Discontinue tobacco abuse °

## 2020-02-05 ENCOUNTER — Ambulatory Visit (INDEPENDENT_AMBULATORY_CARE_PROVIDER_SITE_OTHER): Payer: Medicare PPO

## 2020-02-05 DIAGNOSIS — I4891 Unspecified atrial fibrillation: Secondary | ICD-10-CM | POA: Diagnosis not present

## 2020-02-09 ENCOUNTER — Ambulatory Visit (INDEPENDENT_AMBULATORY_CARE_PROVIDER_SITE_OTHER): Payer: Medicare PPO | Admitting: Adult Health

## 2020-02-09 ENCOUNTER — Other Ambulatory Visit: Payer: Self-pay

## 2020-02-09 ENCOUNTER — Encounter: Payer: Self-pay | Admitting: Adult Health

## 2020-02-09 VITALS — BP 132/68 | HR 49 | Temp 98.4°F | Resp 13 | Ht 69.0 in | Wt 240.8 lb

## 2020-02-09 DIAGNOSIS — E1169 Type 2 diabetes mellitus with other specified complication: Secondary | ICD-10-CM | POA: Diagnosis not present

## 2020-02-09 DIAGNOSIS — B370 Candidal stomatitis: Secondary | ICD-10-CM

## 2020-02-09 LAB — POCT GLYCOSYLATED HEMOGLOBIN (HGB A1C): Hemoglobin A1C: 6.1 % — AB (ref 4.0–5.6)

## 2020-02-09 MED ORDER — METFORMIN HCL 500 MG PO TABS: 500.0000 mg | ORAL_TABLET | Freq: Two times a day (BID) | ORAL | 1 refills | Status: DC

## 2020-02-09 MED ORDER — NYSTATIN 100000 UNIT/ML MT SUSP: 5.0000 mL | Freq: Four times a day (QID) | OROMUCOSAL | 0 refills | Status: DC
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
## 2020-02-11 ENCOUNTER — Ambulatory Visit: Payer: Medicare PPO | Admitting: Adult Health

## 2020-02-25 ENCOUNTER — Other Ambulatory Visit: Payer: Self-pay | Admitting: Cardiovascular Disease

## 2020-02-25 ENCOUNTER — Other Ambulatory Visit: Payer: Self-pay

## 2020-02-25 DIAGNOSIS — E1169 Type 2 diabetes mellitus with other specified complication: Secondary | ICD-10-CM

## 2020-02-25 DIAGNOSIS — E785 Hyperlipidemia, unspecified: Secondary | ICD-10-CM

## 2020-02-25 LAB — LIPID PANEL
Chol/HDL Ratio: 4.5 ratio (ref 0.0–5.0)
Cholesterol, Total: 118 mg/dL (ref 100–199)
HDL: 26 mg/dL — ABNORMAL LOW (ref >39)
LDL Chol Calc (NIH): 70 mg/dL (ref 0–99)
Triglycerides: 122 mg/dL (ref 0–149)
VLDL Cholesterol Cal: 22 mg/dL (ref 5–40)

## 2020-04-02 ENCOUNTER — Other Ambulatory Visit: Payer: Self-pay | Admitting: Cardiovascular Disease

## 2020-04-20 ENCOUNTER — Telehealth: Payer: Self-pay | Admitting: Adult Health

## 2020-04-20 NOTE — Telephone Encounter (Signed)
Tried calling pt to  schedule Medicare Annual Wellness Visit (AWV) either virtually or in office.  No answer  Last AWV no information  please schedule at anytime with [facility] Nurse Health Advisor 1 or 2   This should be a 45 minute visit. 

## 2020-05-15 IMAGING — US US SOFT TISSUE HEAD/NECK
1 series · 13 of 13 positions shown · non-contrast
Comparison: None.

CLINICAL DATA: Enlarged lymph node right neck

EXAM:
ULTRASOUND OF HEAD/NECK SOFT TISSUES
TECHNIQUE: Ultrasound examination of the head and neck soft tissues was
performed in the area of clinical concern.

[Series 1: us soft tissue head/neck · 0.06mm/px · 13 of 13 slices shown]
[im 1/13]
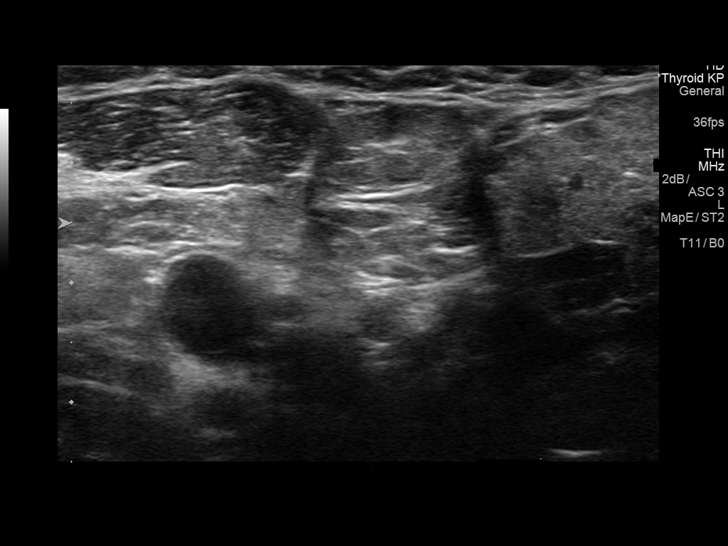
[im 2/13]
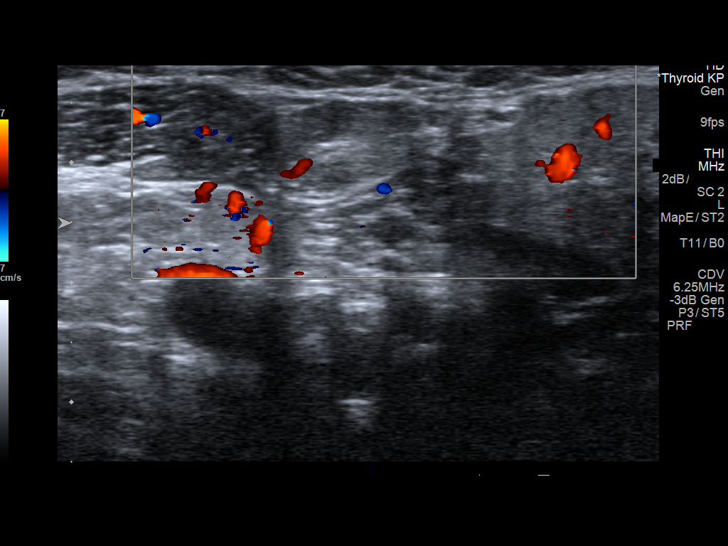
[im 3/13]
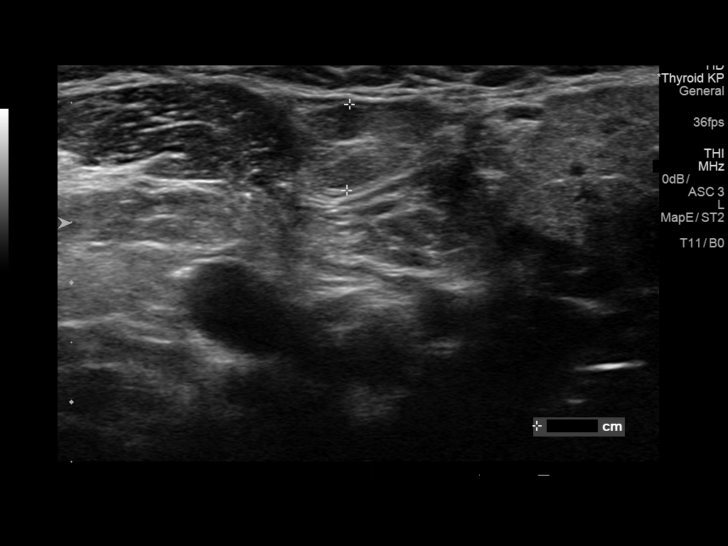
[im 4/13]
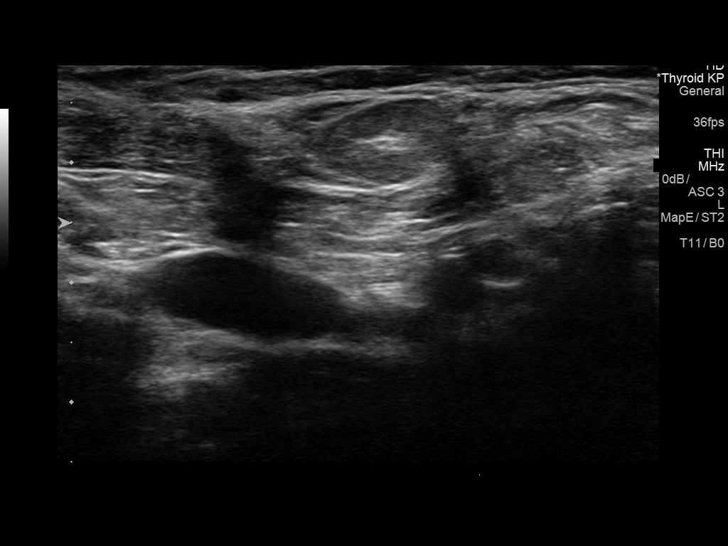
[im 5/13]
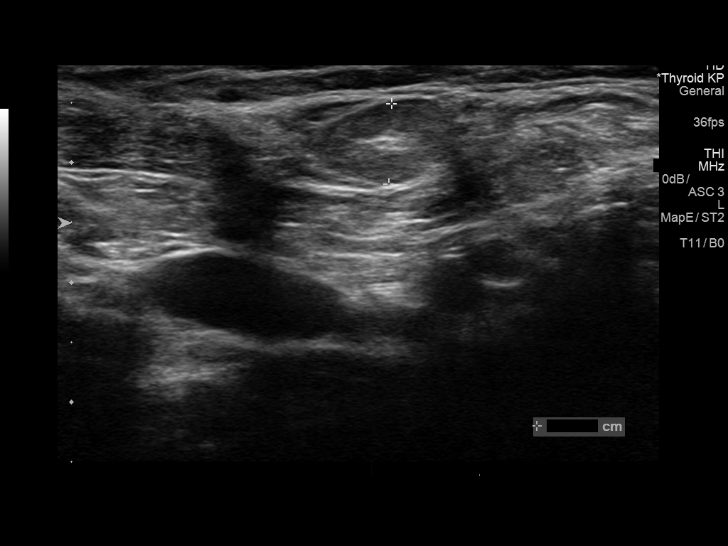
[im 6/13]
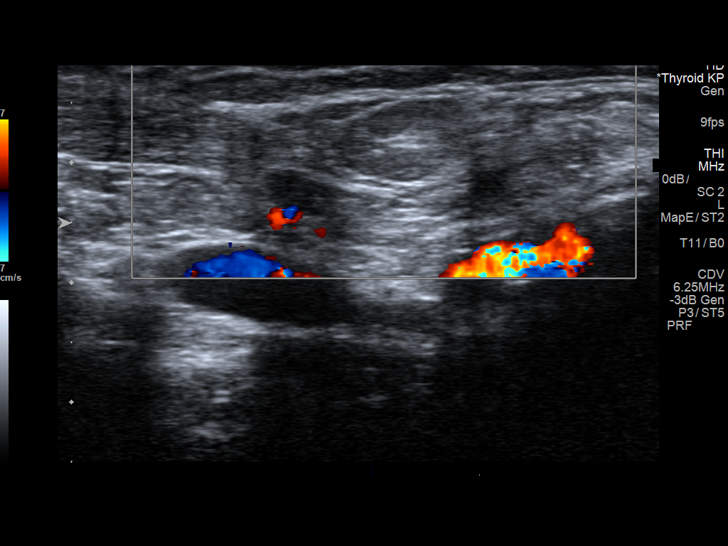
[im 7/13]
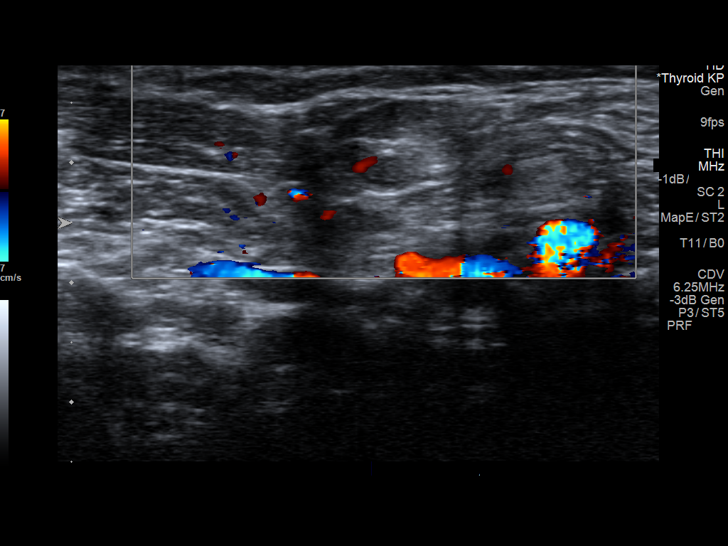
[im 8/13]
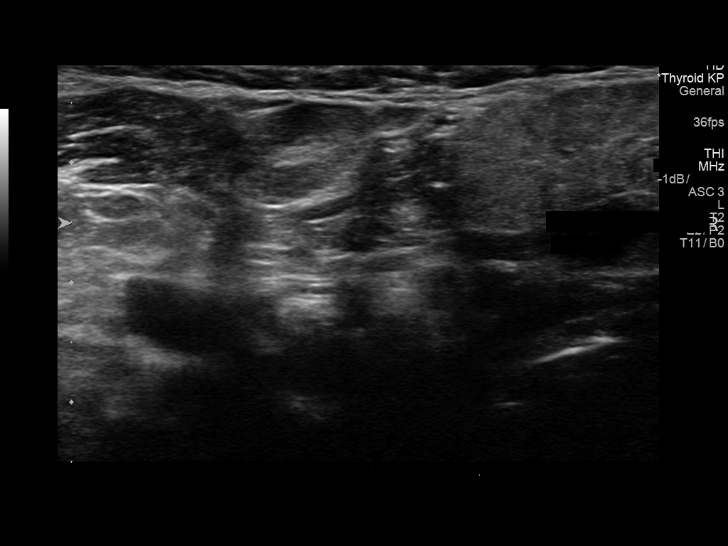
[im 9/13]
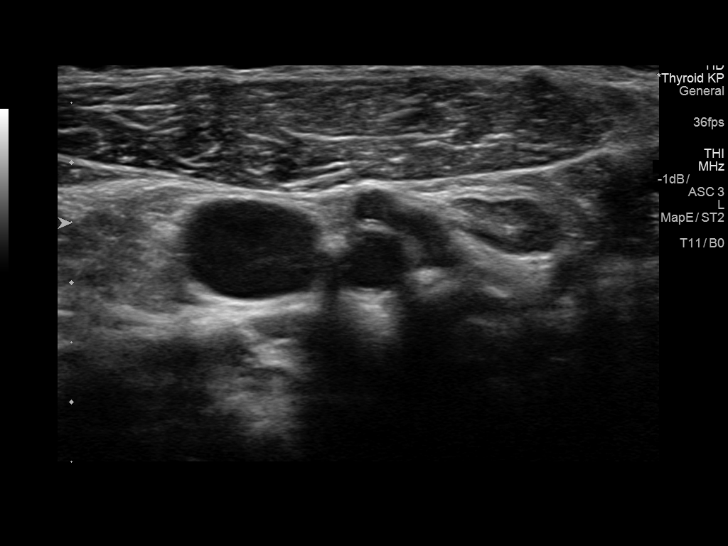
[im 10/13]
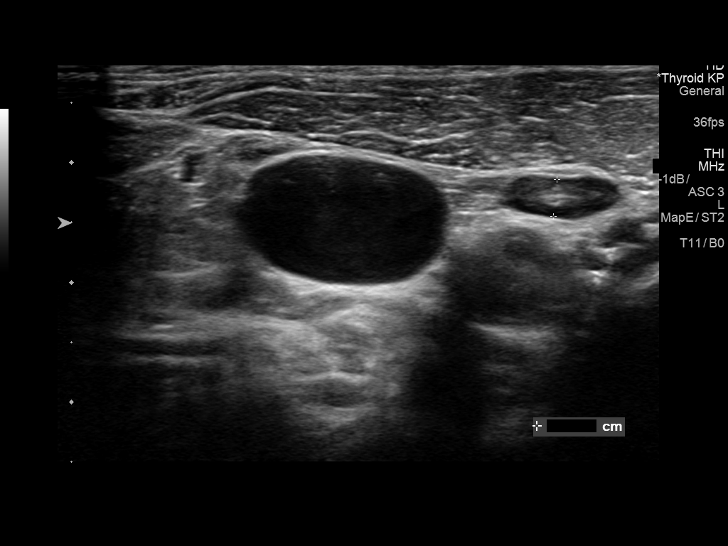
[im 11/13]
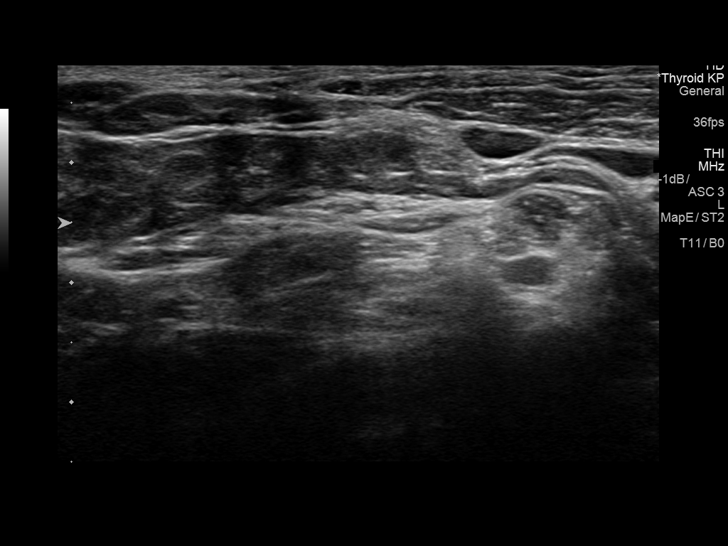
[im 12/13]
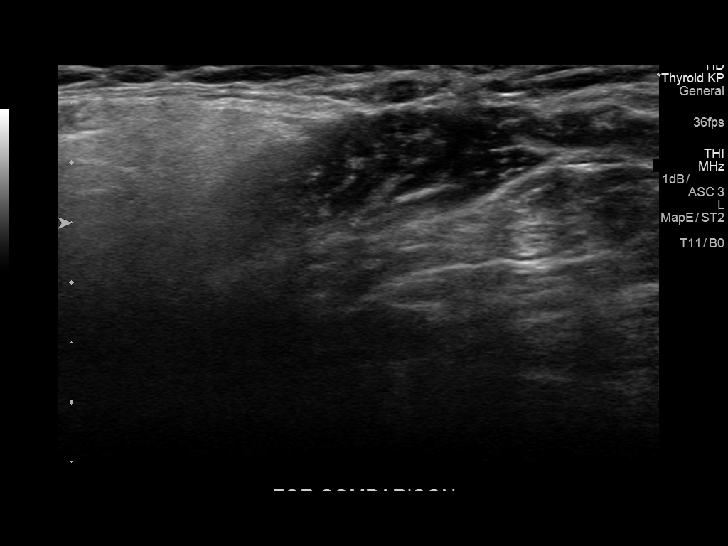
[im 13/13]
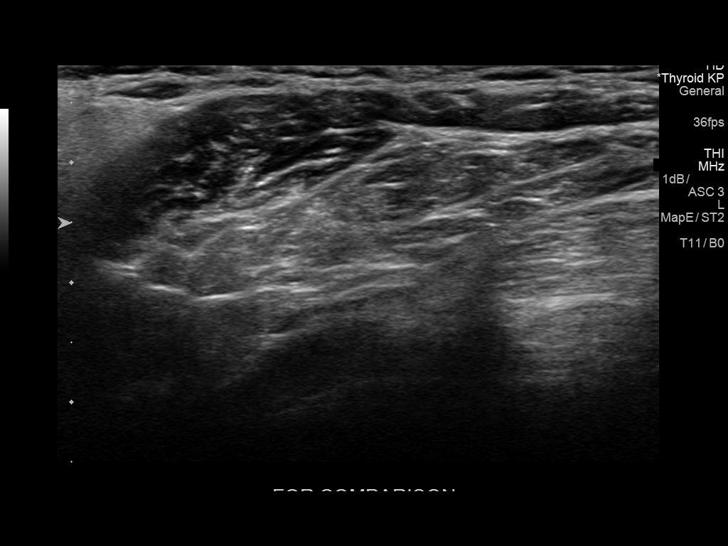

[13 of 13 positions shown; findings below may reference images not displayed]

FINDINGS: Scanning limited to the right submandibular region where there is a
palpable abnormality.

7 mm lymph node in the right submandibular region appears benign
with normal fatty hilum. No cystic or solid mass in the region.
IMPRESSION: 7 mm submandibular node on the right with a benign appearance by
ultrasound.

## 2020-07-07 ENCOUNTER — Telehealth: Payer: Self-pay | Admitting: Adult Health

## 2020-07-07 NOTE — Telephone Encounter (Signed)
Left message for patient to call back and schedule Medicare Annual Wellness Visit (AWV) either virtually or in office. No detailed message left   AWVI  please schedule at anytime with [facility] Nurse Health Advisor 1 or 2   This should be a 45 minute visit. 
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
## 2020-07-27 ENCOUNTER — Encounter: Payer: Self-pay | Admitting: General Practice

## 2020-07-27 ENCOUNTER — Ambulatory Visit: Payer: Medicare PPO | Admitting: General Practice

## 2020-07-27 ENCOUNTER — Other Ambulatory Visit: Payer: Self-pay

## 2020-07-27 VITALS — BP 128/72 | HR 62 | Ht 69.0 in | Wt 234.0 lb

## 2020-07-27 DIAGNOSIS — I499 Cardiac arrhythmia, unspecified: Secondary | ICD-10-CM

## 2020-07-27 DIAGNOSIS — I200 Unstable angina: Secondary | ICD-10-CM

## 2020-07-27 DIAGNOSIS — I4510 Unspecified right bundle-branch block: Secondary | ICD-10-CM

## 2020-07-27 DIAGNOSIS — E1169 Type 2 diabetes mellitus with other specified complication: Secondary | ICD-10-CM

## 2020-07-27 DIAGNOSIS — I5041 Acute combined systolic (congestive) and diastolic (congestive) heart failure: Secondary | ICD-10-CM

## 2020-07-27 DIAGNOSIS — I10 Essential (primary) hypertension: Secondary | ICD-10-CM

## 2020-07-27 DIAGNOSIS — E785 Hyperlipidemia, unspecified: Secondary | ICD-10-CM

## 2020-07-27 MED ORDER — ISOSORBIDE MONONITRATE ER 30 MG PO TB24: 30.0000 mg | ORAL_TABLET | Freq: Every day | ORAL | 6 refills | Status: DC

## 2020-07-27 MED ORDER — FENOFIBRATE 145 MG PO TABS: 72.5000 mg | ORAL_TABLET | Freq: Every day | ORAL | 1 refills | Status: DC

## 2020-07-27 NOTE — Patient Instructions (Signed)
Medication Instructions:  INCREASE ISOSORBIDE 30MG  DAILY-ONE WHOLE PILL *If you need a refill on your cardiac medications before your next appointment, please call your pharmacy*  Lab Work:   Testing/Procedures:  NONE    NONE  Special Instructions PLEASE READ AND FOLLOW SALTY 6-ATTACHED-1,800mg  daily  Follow-Up: Your next appointment:  1 month(s) In Person with Quay Burow, MD OR IF UNAVAILABLE [place], FNP-C   At [facility], you and your health needs are our priority.  As part of our continuing mission to provide you with exceptional heart care, we have created designated Provider Care Teams.  These Care Teams include your primary Cardiologist (physician) and Advanced Practice Providers (APPs -  Physician Assistants and Nurse Practitioners) who all work together to provide you with the care you need, when you need it.            6 SALTY THINGS TO AVOID     1,800MG  DAILY

## 2020-08-09 ENCOUNTER — Ambulatory Visit: Payer: Medicare PPO | Admitting: Adult Health

## 2020-08-14 ENCOUNTER — Other Ambulatory Visit: Payer: Self-pay

## 2020-08-15 ENCOUNTER — Ambulatory Visit: Payer: Medicare PPO | Admitting: Adult Health

## 2020-08-15 ENCOUNTER — Encounter: Payer: Self-pay | Admitting: Adult Health

## 2020-08-15 VITALS — BP 132/78 | HR 63 | Temp 97.6°F | Wt 235.6 lb

## 2020-08-15 DIAGNOSIS — I10 Essential (primary) hypertension: Secondary | ICD-10-CM | POA: Diagnosis not present

## 2020-08-15 DIAGNOSIS — Z720 Tobacco use: Secondary | ICD-10-CM

## 2020-08-15 DIAGNOSIS — Z0000 Encounter for general adult medical examination without abnormal findings: Secondary | ICD-10-CM | POA: Diagnosis not present

## 2020-08-15 DIAGNOSIS — Z125 Encounter for screening for malignant neoplasm of prostate: Secondary | ICD-10-CM | POA: Diagnosis not present

## 2020-08-15 DIAGNOSIS — E785 Hyperlipidemia, unspecified: Secondary | ICD-10-CM | POA: Diagnosis not present

## 2020-08-15 DIAGNOSIS — E1169 Type 2 diabetes mellitus with other specified complication: Secondary | ICD-10-CM | POA: Diagnosis not present

## 2020-08-15 LAB — LIPID PANEL
Cholesterol: 99 mg/dL (ref 0–200)
HDL: 29 mg/dL — ABNORMAL LOW (ref >39.00)
LDL Cholesterol: 52 mg/dL (ref 0–99)
NonHDL: 70.2
Total CHOL/HDL Ratio: 3
Triglycerides: 89 mg/dL (ref 0.0–149.0)
VLDL: 17.8 mg/dL (ref 0.0–40.0)

## 2020-08-15 LAB — CBC WITH DIFFERENTIAL/PLATELET
Basophils Absolute: 0 10*3/uL (ref 0.0–0.1)
Basophils Relative: 0.7 % (ref 0.0–3.0)
Eosinophils Absolute: 0.1 10*3/uL (ref 0.0–0.7)
Eosinophils Relative: 2.6 % (ref 0.0–5.0)
HCT: 38.1 % — ABNORMAL LOW (ref 39.0–52.0)
Hemoglobin: 12.8 g/dL — ABNORMAL LOW (ref 13.0–17.0)
Lymphocytes Relative: 38.2 % (ref 12.0–46.0)
Lymphs Abs: 1.4 10*3/uL (ref 0.7–4.0)
MCHC: 33.7 g/dL (ref 30.0–36.0)
MCV: 89 fl (ref 78.0–100.0)
Monocytes Absolute: 0.4 10*3/uL (ref 0.1–1.0)
Monocytes Relative: 11.2 % (ref 3.0–12.0)
Neutro Abs: 1.7 10*3/uL (ref 1.4–7.7)
Neutrophils Relative %: 47.3 % (ref 43.0–77.0)
Platelets: 242 10*3/uL (ref 150.0–400.0)
RBC: 4.28 Mil/uL (ref 4.22–5.81)
RDW: 14.3 % (ref 11.5–15.5)
WBC: 3.6 10*3/uL — ABNORMAL LOW (ref 4.0–10.5)

## 2020-08-15 LAB — TSH: TSH: 1.26 u[IU]/mL (ref 0.35–4.50)

## 2020-08-15 LAB — COMPREHENSIVE METABOLIC PANEL
ALT: 17 U/L (ref 0–53)
AST: 14 U/L (ref 0–37)
Albumin: 4.2 g/dL (ref 3.5–5.2)
Alkaline Phosphatase: 49 U/L (ref 39–117)
BUN: 14 mg/dL (ref 6–23)
CO2: 27 mEq/L (ref 19–32)
Calcium: 9.9 mg/dL (ref 8.4–10.5)
Chloride: 105 mEq/L (ref 96–112)
Creatinine, Ser: 0.95 mg/dL (ref 0.40–1.50)
GFR: 81.88 mL/min (ref >60.00)
Glucose, Bld: 92 mg/dL (ref 70–99)
Potassium: 4.4 mEq/L (ref 3.5–5.1)
Sodium: 140 mEq/L (ref 135–145)
Total Bilirubin: 0.9 mg/dL (ref 0.2–1.2)
Total Protein: 7.2 g/dL (ref 6.0–8.3)

## 2020-08-15 LAB — URINALYSIS, ROUTINE W REFLEX MICROSCOPIC
Bilirubin Urine: NEGATIVE
Ketones, ur: NEGATIVE
Leukocytes,Ua: NEGATIVE
Nitrite: NEGATIVE
Specific Gravity, Urine: 1.015 (ref 1.000–1.030)
Total Protein, Urine: NEGATIVE
Urine Glucose: NEGATIVE
Urobilinogen, UA: 2 — AB (ref 0.0–1.0)
pH: 7 (ref 5.0–8.0)

## 2020-08-15 LAB — PSA: PSA: 0.31 ng/mL (ref 0.10–4.00)

## 2020-08-15 LAB — HEMOGLOBIN A1C: Hgb A1c MFr Bld: 6.6 % — ABNORMAL HIGH (ref 4.6–6.5)
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
## 2020-08-15 NOTE — Addendum Note (Signed)
Addended by: Elmer Picker on: 08/15/2020 10:09 AM   Modules accepted: Orders

## 2020-08-15 NOTE — Patient Instructions (Signed)
It was great seeing you today!   We will follow up with you regarding your blood work   Please start exercising more

## 2020-08-16 ENCOUNTER — Other Ambulatory Visit: Payer: Self-pay | Admitting: Adult Health

## 2020-08-16 DIAGNOSIS — R319 Hematuria, unspecified: Secondary | ICD-10-CM

## 2020-08-22 ENCOUNTER — Other Ambulatory Visit: Payer: Self-pay | Admitting: Cardiovascular Disease

## 2020-08-30 ENCOUNTER — Other Ambulatory Visit: Payer: Self-pay

## 2020-08-30 ENCOUNTER — Ambulatory Visit: Payer: Medicare PPO | Admitting: Cardiovascular Disease

## 2020-08-30 ENCOUNTER — Encounter: Payer: Self-pay | Admitting: Cardiovascular Disease

## 2020-08-30 VITALS — BP 110/80 | HR 60 | Ht 69.0 in | Wt 230.0 lb

## 2020-08-30 DIAGNOSIS — I4510 Unspecified right bundle-branch block: Secondary | ICD-10-CM

## 2020-08-30 DIAGNOSIS — E785 Hyperlipidemia, unspecified: Secondary | ICD-10-CM | POA: Diagnosis not present

## 2020-08-30 DIAGNOSIS — I200 Unstable angina: Secondary | ICD-10-CM | POA: Diagnosis not present

## 2020-08-30 DIAGNOSIS — I4891 Unspecified atrial fibrillation: Secondary | ICD-10-CM

## 2020-08-30 DIAGNOSIS — I10 Essential (primary) hypertension: Secondary | ICD-10-CM | POA: Diagnosis not present

## 2020-08-30 DIAGNOSIS — I499 Cardiac arrhythmia, unspecified: Secondary | ICD-10-CM

## 2020-08-30 DIAGNOSIS — E1169 Type 2 diabetes mellitus with other specified complication: Secondary | ICD-10-CM | POA: Diagnosis not present

## 2020-08-30 NOTE — Patient Instructions (Signed)

## 2020-08-30 NOTE — Assessment & Plan Note (Signed)
History of occasional palpitations with event monitor performed 02/05/2020 revealing frequent PVCs, occasional PACs and short runs of SVT and nonsustained VT.  These are no longer an issue currently.

## 2020-08-30 NOTE — Assessment & Plan Note (Signed)
Chronic. 

## 2020-08-30 NOTE — Progress Notes (Signed)
08/30/2020 Kyle Ballard [facility]   09-18-1950  [id number]  Primary Physician Kyle Ballard, Kyle Rumps, NP Primary Cardiologist: Kyle Harp MD Kyle Ballard, [place]  HPI:  Kyle Ballard is a 70 y.o.  moderately overweight married African-American male father of 2, grandfather of one grandchild who is retired from working [employer] in [place]. I last saw him in the office  02/02/2020.Marland Kitchen Ehas a history of treated hypertension, diabetes and hyperlipidemia. He smoked for many years but is currently trying to quit. He had cardiac catheterization performed October 2015 revealing moderate nonobstructive CAD. Medical therapy was recommended. Because of accelerated angina he was admitted to the hospital 04/10/2018 and underwent diagnostic cath by Dr. Martinique revealing moderate branch vessel disease with obstructive lesions lesions and vessels too small for PCI. His medicines were adjusted, long acting nitrate was added and medical therapy was recommended.   Since I saw him 8 months ago he continues to do well.  He no longer has palpitations.  He walks on a daily basis.  He denies chest pain or shortness of breath.  He recently had an excellent lipid profile performed 08/15/2020 with a total cholesterol 99, LDL 52 and HDL of 29.  A event monitor performed 02/05/2020 showed frequent PVCs, occasional PACs and short runs of SVT and nonsustained VT.   Current Meds  Medication Sig  . aspirin EC 81 MG EC tablet Take 1 tablet (81 mg total) by mouth daily.  Marland Kitchen atorvastatin (LIPITOR) 40 MG tablet TAKE 1 TABLET BY MOUTH ONCE DAILY AT 6PM . APPOINTMENT REQUIRED FOR FUTURE REFILLS  . carvedilol (COREG) 6.25 MG tablet TAKE 1 TABLET BY MOUTH TWICE DAILY WITH A MEAL  . CINNAMON PO Take 2 tablets by mouth once as needed.  . clopidogrel (PLAVIX) 75 MG tablet Take 1 tablet by mouth once daily  . fenofibrate (TRICOR) 145 MG tablet Take 0.5 tablets (72.5 mg total) by mouth daily.  . isosorbide mononitrate  (IMDUR) 30 MG 24 hr tablet Take 1 tablet (30 mg total) by mouth daily.  Marland Kitchen losartan (COZAAR) 100 MG tablet Take 1 tablet by mouth once daily  . NITROSTAT 0.4 MG SL tablet DISSOLVE ONE TABLET UNDER THE TONGUE EVERY 5 MINUTES AS NEEDED FOR CHEST PAIN. (Patient taking differently: Place 0.4 mg under the tongue every 5 (five) minutes as needed for chest pain.)     Allergies  Allergen Reactions  . Lisinopril Swelling  . Benadryl [Diphenhydramine Hcl] Other (See Comments)    Not really sure if has allergy; but past concern may have caused lip swelling     Social History   Socioeconomic History  . Marital status: Married    Spouse name: Not on file  . Number of children: 2  . Years of education: Not on file  . Highest education level: Not on file  Occupational History  . Occupation: retired  Tobacco Use  . Smoking status: Former Smoker    Packs/day: 0.25    Types: Cigarettes    Quit date: 08/2019    Years since quitting: 1.0  . Smokeless tobacco: Never Used  Substance and Sexual Activity  . Alcohol use: No    Alcohol/week: 0.0 standard drinks  . Drug use: No  . Sexual activity: Not on file  Other Topics Concern  . Not on file  Social History Narrative   Works at [employer]; His wife is on custodial staff at [employer]; Son in [place]   Social Determinants of Health  Financial Resource Strain: Not on file  Food Insecurity: Not on file  Transportation Needs: Not on file  Physical Activity: Not on file  Stress: Not on file  Social Connections: Not on file  Intimate Partner Violence: Not on file     Review of Systems: General: negative for chills, fever, night sweats or weight changes.  Cardiovascular: negative for chest pain, dyspnea on exertion, edema, orthopnea, palpitations, paroxysmal nocturnal dyspnea or shortness of breath Dermatological: negative for rash Respiratory: negative for cough or wheezing Urologic: negative for hematuria Abdominal: negative for nausea,  vomiting, diarrhea, bright red blood per rectum, melena, or hematemesis Neurologic: negative for visual changes, syncope, or dizziness All other systems reviewed and are otherwise negative except as noted above.    Blood pressure 110/80, pulse 60, height 5\' 9"  (1.753 m), weight 230 lb (104.3 kg).  General appearance: alert and no distress Neck: no adenopathy, no carotid bruit, no JVD, supple, symmetrical, trachea midline and thyroid not enlarged, symmetric, no tenderness/mass/nodules Lungs: clear to auscultation bilaterally Heart: regular rate and rhythm, S1, S2 normal, no murmur, click, rub or gallop Extremities: extremities normal, atraumatic, no cyanosis or edema Pulses: 2+ and symmetric Skin: Skin color, texture, turgor normal. No rashes or lesions Neurologic: Alert and oriented X 3, normal strength and tone. Normal symmetric reflexes. Normal coordination and gait  EKG sinus rhythm at 68 with right bundle branch block and occasional PVCs in a bigeminal pattern.  I personally reviewed this EKG.  ASSESSMENT AND PLAN:   Hypertension History of essential hypertension a blood pressure measured today 110/80.  He is on carvedilol and losartan.  Hyperlipidemia associated with type 2 diabetes mellitus ([place]) History of hyperlipidemia on statin therapy with lipid profile performed 08/15/2020 revealing total cholesterol 99, LDL 52 and HDL of 29.  Unstable angina (HCC) History of CAD status post cardiac catheterization performed October 2015 that showed moderate nonobstructive CAD.  He underwent repeat cath 04/10/2018 by Dr. Martinique again revealing moderate branch vessel disease with obstructive lesions in vessels too small for PCI.  His medicines were adjusted.  He has had no further chest pain.  Right bundle branch block Chronic  Irregular heart rate History of occasional palpitations with event monitor performed 02/05/2020 revealing frequent PVCs, occasional PACs and short runs of SVT and  nonsustained VT.  These are no longer an issue currently.      Kyle Harp MD FACP,FACC,FAHA, [facility] 08/30/2020 10:26 AM

## 2020-08-30 NOTE — Assessment & Plan Note (Signed)
History of essential hypertension a blood pressure measured today 110/80.  He is on carvedilol and losartan.

## 2020-08-30 NOTE — Assessment & Plan Note (Signed)
History of hyperlipidemia on statin therapy with lipid profile performed 08/15/2020 revealing total cholesterol 99, LDL 52 and HDL of 29.

## 2020-08-30 NOTE — Assessment & Plan Note (Signed)
History of CAD status post cardiac catheterization performed October 2015 that showed moderate nonobstructive CAD.  He underwent repeat cath 04/10/2018 by Dr. Martinique again revealing moderate branch vessel disease with obstructive lesions in vessels too small for PCI.  His medicines were adjusted.  He has had no further chest pain.

## 2020-09-29 DIAGNOSIS — R310 Gross hematuria: Secondary | ICD-10-CM | POA: Diagnosis not present

## 2020-10-11 ENCOUNTER — Other Ambulatory Visit: Payer: Self-pay | Admitting: Cardiovascular Disease

## 2020-10-11 DIAGNOSIS — E785 Hyperlipidemia, unspecified: Secondary | ICD-10-CM

## 2020-10-11 DIAGNOSIS — I10 Essential (primary) hypertension: Secondary | ICD-10-CM

## 2020-10-12 DIAGNOSIS — R310 Gross hematuria: Secondary | ICD-10-CM | POA: Diagnosis not present

## 2020-10-18 ENCOUNTER — Encounter: Payer: Self-pay | Admitting: Adult Health

## 2020-11-01 ENCOUNTER — Telehealth: Payer: Self-pay | Admitting: Adult Health

## 2020-11-01 NOTE — Telephone Encounter (Signed)
Left message for patient to call back and schedule Medicare Annual Wellness Visit (AWV) either virtually or in office.   AWV-I per [place] 06/06/17 please schedule at anytime with [facility] Nurse Health Advisor 1 or 2   This should be a 45 minute visit.

## 2020-11-13 ENCOUNTER — Other Ambulatory Visit: Payer: Self-pay | Admitting: Cardiovascular Disease

## 2020-11-16 DIAGNOSIS — R310 Gross hematuria: Secondary | ICD-10-CM | POA: Diagnosis not present

## 2020-11-18 ENCOUNTER — Other Ambulatory Visit: Payer: Self-pay | Admitting: Adult Health

## 2020-11-18 DIAGNOSIS — E1169 Type 2 diabetes mellitus with other specified complication: Secondary | ICD-10-CM

## 2020-11-20 ENCOUNTER — Other Ambulatory Visit: Payer: Self-pay | Admitting: Urology

## 2020-11-20 DIAGNOSIS — D49512 Neoplasm of unspecified behavior of left kidney: Secondary | ICD-10-CM

## 2020-12-03 ENCOUNTER — Ambulatory Visit
Admission: RE | Admit: 2020-12-03 | Discharge: 2020-12-03 | Disposition: A | Discharge status: Home / Self Care | Payer: Medicare PPO | Source: Ambulatory Visit | Attending: Urology | Admitting: Urology

## 2020-12-03 DIAGNOSIS — N2889 Other specified disorders of kidney and ureter: Secondary | ICD-10-CM | POA: Diagnosis not present

## 2020-12-03 DIAGNOSIS — D49512 Neoplasm of unspecified behavior of left kidney: Secondary | ICD-10-CM

## 2020-12-03 MED ORDER — GADOBENATE DIMEGLUMINE 529 MG/ML IV SOLN
20.0000 mL | Freq: Once | INTRAVENOUS | Status: AC | PRN
  Administered 2020-12-03: 20 mL via INTRAVENOUS

## 2020-12-05 ENCOUNTER — Telehealth: Payer: Self-pay | Admitting: Adult Health

## 2020-12-05 NOTE — Telephone Encounter (Signed)
Left message for patient to call back and schedule Medicare Annual Wellness Visit (AWV) either virtually or in office.   AWV-I per [place] 06/06/17 please schedule at anytime with [facility] Nurse Health Advisor 1 or 2   This should be a 45 minute visit.

## 2020-12-12 ENCOUNTER — Telehealth: Payer: Self-pay | Admitting: Adult Health

## 2020-12-12 NOTE — Telephone Encounter (Signed)
Left message asking pt to call I gave office number and my jabber number  Please r/s 12/13/20 AWV appointment Mickel Baas will be out of office

## 2020-12-13 ENCOUNTER — Ambulatory Visit: Payer: Medicare PPO

## 2020-12-21 ENCOUNTER — Telehealth: Payer: Self-pay

## 2020-12-21 NOTE — Telephone Encounter (Signed)
Message sent to [place]

## 2020-12-22 ENCOUNTER — Telehealth: Payer: Self-pay | Admitting: Adult Health

## 2020-12-22 NOTE — Telephone Encounter (Signed)
Patient is returning Kyle Ballard's call.  Please call patient back after 2:15 pm.

## 2020-12-22 NOTE — Telephone Encounter (Signed)
Patient notified of update  and verbalized understanding. 

## 2020-12-22 NOTE — Telephone Encounter (Signed)
Left message to return phone call.

## 2020-12-22 NOTE — Telephone Encounter (Signed)
Called pt no answer °

## 2020-12-25 NOTE — Telephone Encounter (Signed)
This has been taking care of.

## 2020-12-27 ENCOUNTER — Ambulatory Visit: Payer: Medicare PPO

## 2021-01-05 DIAGNOSIS — R310 Gross hematuria: Secondary | ICD-10-CM | POA: Diagnosis not present

## 2021-01-05 DIAGNOSIS — N281 Cyst of kidney, acquired: Secondary | ICD-10-CM | POA: Diagnosis not present

## 2021-01-05 DIAGNOSIS — N200 Calculus of kidney: Secondary | ICD-10-CM | POA: Diagnosis not present

## 2021-01-05 DIAGNOSIS — N400 Enlarged prostate without lower urinary tract symptoms: Secondary | ICD-10-CM | POA: Diagnosis not present

## 2021-01-15 ENCOUNTER — Ambulatory Visit: Payer: Medicare PPO

## 2021-01-25 ENCOUNTER — Other Ambulatory Visit: Payer: Self-pay

## 2021-01-25 ENCOUNTER — Ambulatory Visit (INDEPENDENT_AMBULATORY_CARE_PROVIDER_SITE_OTHER): Payer: Medicare PPO

## 2021-01-25 VITALS — BP 138/78 | HR 73 | Temp 98.2°F | Ht 69.0 in | Wt 230.0 lb

## 2021-01-25 DIAGNOSIS — E119 Type 2 diabetes mellitus without complications: Secondary | ICD-10-CM | POA: Diagnosis not present

## 2021-01-25 DIAGNOSIS — Z0000 Encounter for general adult medical examination without abnormal findings: Secondary | ICD-10-CM | POA: Diagnosis not present

## 2021-01-25 DIAGNOSIS — Z0100 Encounter for examination of eyes and vision without abnormal findings: Secondary | ICD-10-CM | POA: Diagnosis not present
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
## 2021-01-25 NOTE — Patient Instructions (Signed)
[name] , Thank you for taking time to come for your Medicare Wellness Visit. I appreciate your ongoing commitment to your health goals. Please review the following plan we discussed and let me know if I can assist you in the future.   Screening recommendations/referrals: Colonoscopy: 12/13/2014  due 2026 Recommended yearly ophthalmology/optometry visit for glaucoma screening and checkup Recommended yearly dental visit for hygiene and checkup  Vaccinations: Influenza vaccine: due in fall 2022  Pneumococcal vaccine: completed series  Tdap vaccine: 03/06/2015 Shingles vaccine: will consider     Advanced directives: none   Conditions/risks identified: none   Next appointment: [facility] 01/28/2022  1030  Preventive Care 69 Years and Older, Male Preventive care refers to lifestyle choices and visits with your health care provider that can promote health and wellness. What does preventive care include? A yearly physical exam. This is also called an annual well check. Dental exams once or twice a year. Routine eye exams. Ask your health care provider how often you should have your eyes checked. Personal lifestyle choices, including: Daily care of your teeth and gums. Regular physical activity. Eating a healthy diet. Avoiding tobacco and drug use. Limiting alcohol use. Practicing safe sex. Taking low doses of aspirin every day. Taking vitamin and mineral supplements as recommended by your health care provider. What happens during an annual well check? The services and screenings done by your health care provider during your annual well check will depend on your age, overall health, lifestyle risk factors, and family history of disease. Counseling  Your health care provider may ask you questions about your: Alcohol use. Tobacco use. Drug use. Emotional well-being. Home and relationship well-being. Sexual activity. Eating habits. History of falls. Memory and ability to understand  (cognition). Work and work Statistician. Screening  You may have the following tests or measurements: Height, weight, and BMI. Blood pressure. Lipid and cholesterol levels. These may be checked every 5 years, or more frequently if you are over 54 years old. Skin check. Lung cancer screening. You may have this screening every year starting at age 61 if you have a 30-pack-year history of smoking and currently smoke or have quit within the past 15 years. Fecal occult blood test (FOBT) of the stool. You may have this test every year starting at age 74. Flexible sigmoidoscopy or colonoscopy. You may have a sigmoidoscopy every 5 years or a colonoscopy every 10 years starting at age 19. Prostate cancer screening. Recommendations will vary depending on your family history and other risks. Hepatitis C blood test. Hepatitis B blood test. Sexually transmitted disease (STD) testing. Diabetes screening. This is done by checking your blood sugar (glucose) after you have not eaten for a while (fasting). You may have this done every 1-3 years. Abdominal aortic aneurysm (AAA) screening. You may need this if you are a current or former smoker. Osteoporosis. You may be screened starting at age 17 if you are at high risk. Talk with your health care provider about your test results, treatment options, and if necessary, the need for more tests. Vaccines  Your health care provider may recommend certain vaccines, such as: Influenza vaccine. This is recommended every year. Tetanus, diphtheria, and acellular pertussis (Tdap, Td) vaccine. You may need a Td booster every 10 years. Zoster vaccine. You may need this after age 15. Pneumococcal 13-valent conjugate (PCV13) vaccine. One dose is recommended after age 56. Pneumococcal polysaccharide (PPSV23) vaccine. One dose is recommended after age 44. Talk to your health care provider about which  screenings and vaccines you need and how often you need them. This  information is not intended to replace advice given to you by your health care provider. Make sure you discuss any questions you have with your health care provider. Document Released: 05/19/2015 Document Revised: 01/10/2016 Document Reviewed: 02/21/2015 Elsevier Interactive Patient Education  2017 [place] Prevention in the Home Falls can cause injuries. They can happen to people of all ages. There are many things you can do to make your home safe and to help prevent falls. What can I do on the outside of my home? Regularly fix the edges of walkways and driveways and fix any cracks. Remove anything that might make you trip as you walk through a door, such as a raised step or threshold. Trim any bushes or trees on the path to your home. Use bright outdoor lighting. Clear any walking paths of anything that might make someone trip, such as rocks or tools. Regularly check to see if handrails are loose or broken. Make sure that both sides of any steps have handrails. Any raised decks and porches should have guardrails on the edges. Have any leaves, snow, or ice cleared regularly. Use sand or salt on walking paths during winter. Clean up any spills in your garage right away. This includes oil or grease spills. What can I do in the bathroom? Use night lights. Install grab bars by the toilet and in the tub and shower. Do not use towel bars as grab bars. Use non-skid mats or decals in the tub or shower. If you need to sit down in the shower, use a plastic, non-slip stool. Keep the floor dry. Clean up any water that spills on the floor as soon as it happens. Remove soap buildup in the tub or shower regularly. Attach bath mats securely with double-sided non-slip rug tape. Do not have throw rugs and other things on the floor that can make you trip. What can I do in the bedroom? Use night lights. Make sure that you have a light by your bed that is easy to reach. Do not use any sheets or  blankets that are too big for your bed. They should not hang down onto the floor. Have a firm chair that has side arms. You can use this for support while you get dressed. Do not have throw rugs and other things on the floor that can make you trip. What can I do in the kitchen? Clean up any spills right away. Avoid walking on wet floors. Keep items that you use a lot in easy-to-reach places. If you need to reach something above you, use a strong step stool that has a grab bar. Keep electrical cords out of the way. Do not use floor polish or wax that makes floors slippery. If you must use wax, use non-skid floor wax. Do not have throw rugs and other things on the floor that can make you trip. What can I do with my stairs? Do not leave any items on the stairs. Make sure that there are handrails on both sides of the stairs and use them. Fix handrails that are broken or loose. Make sure that handrails are as long as the stairways. Check any carpeting to make sure that it is firmly attached to the stairs. Fix any carpet that is loose or worn. Avoid having throw rugs at the top or bottom of the stairs. If you do have throw rugs, attach them to the floor with carpet  tape. Make sure that you have a light switch at the top of the stairs and the bottom of the stairs. If you do not have them, ask someone to add them for you. What else can I do to help prevent falls? Wear shoes that: Do not have high heels. Have rubber bottoms. Are comfortable and fit you well. Are closed at the toe. Do not wear sandals. If you use a stepladder: Make sure that it is fully opened. Do not climb a closed stepladder. Make sure that both sides of the stepladder are locked into place. Ask someone to hold it for you, if possible. Clearly mark and make sure that you can see: Any grab bars or handrails. First and last steps. Where the edge of each step is. Use tools that help you move around (mobility aids) if they are  needed. These include: Canes. Walkers. Scooters. Crutches. Turn on the lights when you go into a dark area. Replace any light bulbs as soon as they burn out. Set up your furniture so you have a clear path. Avoid moving your furniture around. If any of your floors are uneven, fix them. If there are any pets around you, be aware of where they are. Review your medicines with your doctor. Some medicines can make you feel dizzy. This can increase your chance of falling. Ask your doctor what other things that you can do to help prevent falls. This information is not intended to replace advice given to you by your health care provider. Make sure you discuss any questions you have with your health care provider. Document Released: 02/16/2009 Document Revised: 09/28/2015 Document Reviewed: 05/27/2014 Elsevier Interactive Patient Education  2017 [employer].

## 2021-02-06 ENCOUNTER — Other Ambulatory Visit: Payer: Self-pay

## 2021-02-07 ENCOUNTER — Ambulatory Visit: Payer: Medicare PPO | Admitting: Adult Health

## 2021-02-07 ENCOUNTER — Encounter: Payer: Self-pay | Admitting: Adult Health

## 2021-02-07 VITALS — BP 120/70 | HR 57 | Temp 97.9°F | Ht 69.0 in | Wt 236.0 lb

## 2021-02-07 DIAGNOSIS — H6981 Other specified disorders of Eustachian tube, right ear: Secondary | ICD-10-CM | POA: Diagnosis not present

## 2021-02-07 DIAGNOSIS — Z23 Encounter for immunization: Secondary | ICD-10-CM | POA: Diagnosis not present

## 2021-02-07 NOTE — Patient Instructions (Signed)
You have a little fluid behind the ear drum on the right side. I would recommend using Afrin for no longer than three days or [place] Flonase daily to help prevent the fluid from accumulating.
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
## 2021-02-19 ENCOUNTER — Other Ambulatory Visit: Payer: Self-pay | Admitting: Adult Health

## 2021-02-19 DIAGNOSIS — I10 Essential (primary) hypertension: Secondary | ICD-10-CM

## 2021-03-21 ENCOUNTER — Telehealth: Payer: Self-pay

## 2021-03-21 NOTE — Telephone Encounter (Signed)
Last OV for preventative care 08/15/20; AWV 01/25/21.  LVM requesting pt to schedule f/u appt with PCP for chronic medical conditions.

## 2021-04-03 ENCOUNTER — Ambulatory Visit: Payer: Medicare PPO | Admitting: Adult Health

## 2021-04-03 ENCOUNTER — Encounter: Payer: Self-pay | Admitting: Adult Health

## 2021-04-03 VITALS — BP 120/78 | HR 67 | Temp 98.5°F | Ht 69.0 in | Wt 238.0 lb

## 2021-04-03 DIAGNOSIS — I10 Essential (primary) hypertension: Secondary | ICD-10-CM

## 2021-04-03 DIAGNOSIS — E1169 Type 2 diabetes mellitus with other specified complication: Secondary | ICD-10-CM

## 2021-04-03 LAB — POCT GLYCOSYLATED HEMOGLOBIN (HGB A1C): HbA1c, POC (controlled diabetic range): 6.1 % (ref 0.0–7.0)

## 2021-04-03 MED ORDER — METFORMIN HCL 500 MG PO TABS: 500.0000 mg | ORAL_TABLET | Freq: Two times a day (BID) | ORAL | 1 refills | Status: DC

## 2021-04-03 NOTE — Patient Instructions (Signed)
It was great seeing you today   We will follow up with you regarding your lab work   Please let me know if you need anything   I will see you back for your physical exam after 08/15/2021
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
## 2021-04-10 ENCOUNTER — Other Ambulatory Visit: Payer: Self-pay | Admitting: General Practice

## 2021-05-23 ENCOUNTER — Telehealth: Payer: Self-pay | Admitting: Adult Health

## 2021-05-23 NOTE — Telephone Encounter (Signed)
Noted! PPW received

## 2021-05-23 NOTE — Telephone Encounter (Signed)
Left message to return phone call.

## 2021-05-23 NOTE — Telephone Encounter (Signed)
Pt wife Cinda Quest is calling and would like to pick up a copy of FMLA/Matrix  once it has been completed

## 2021-06-11 ENCOUNTER — Other Ambulatory Visit: Payer: Self-pay | Admitting: Adult Health

## 2021-06-11 ENCOUNTER — Other Ambulatory Visit: Payer: Self-pay | Admitting: General Practice

## 2021-06-11 DIAGNOSIS — I10 Essential (primary) hypertension: Secondary | ICD-10-CM

## 2021-06-30 ENCOUNTER — Other Ambulatory Visit: Payer: Self-pay | Admitting: General Practice

## 2021-07-04 DIAGNOSIS — N200 Calculus of kidney: Secondary | ICD-10-CM | POA: Diagnosis not present

## 2021-07-04 DIAGNOSIS — N400 Enlarged prostate without lower urinary tract symptoms: Secondary | ICD-10-CM | POA: Diagnosis not present

## 2021-08-16 ENCOUNTER — Other Ambulatory Visit: Payer: Self-pay | Admitting: Cardiovascular Disease
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
## 2021-09-14 ENCOUNTER — Ambulatory Visit (INDEPENDENT_AMBULATORY_CARE_PROVIDER_SITE_OTHER): Payer: Medicare PPO | Admitting: Adult Health

## 2021-09-14 VITALS — BP 110/82 | HR 64 | Temp 97.9°F | Ht 68.0 in | Wt 234.0 lb

## 2021-09-14 DIAGNOSIS — I2510 Atherosclerotic heart disease of native coronary artery without angina pectoris: Secondary | ICD-10-CM

## 2021-09-14 DIAGNOSIS — Z125 Encounter for screening for malignant neoplasm of prostate: Secondary | ICD-10-CM | POA: Diagnosis not present

## 2021-09-14 DIAGNOSIS — I10 Essential (primary) hypertension: Secondary | ICD-10-CM

## 2021-09-14 DIAGNOSIS — E1169 Type 2 diabetes mellitus with other specified complication: Secondary | ICD-10-CM

## 2021-09-14 DIAGNOSIS — Z0000 Encounter for general adult medical examination without abnormal findings: Secondary | ICD-10-CM | POA: Diagnosis not present

## 2021-09-14 DIAGNOSIS — E785 Hyperlipidemia, unspecified: Secondary | ICD-10-CM | POA: Diagnosis not present

## 2021-09-14 LAB — CBC WITH DIFFERENTIAL/PLATELET
Basophils Absolute: 0 10*3/uL (ref 0.0–0.1)
Basophils Relative: 0.5 % (ref 0.0–3.0)
Eosinophils Absolute: 0.1 10*3/uL (ref 0.0–0.7)
Eosinophils Relative: 2.7 % (ref 0.0–5.0)
HCT: 39.7 % (ref 39.0–52.0)
Hemoglobin: 13.4 g/dL (ref 13.0–17.0)
Lymphocytes Relative: 39.6 % (ref 12.0–46.0)
Lymphs Abs: 1.5 10*3/uL (ref 0.7–4.0)
MCHC: 33.8 g/dL (ref 30.0–36.0)
MCV: 89.9 fl (ref 78.0–100.0)
Monocytes Absolute: 0.5 10*3/uL (ref 0.1–1.0)
Monocytes Relative: 11.6 % (ref 3.0–12.0)
Neutro Abs: 1.8 10*3/uL (ref 1.4–7.7)
Neutrophils Relative %: 45.6 % (ref 43.0–77.0)
Platelets: 277 10*3/uL (ref 150.0–400.0)
RBC: 4.42 Mil/uL (ref 4.22–5.81)
RDW: 14.1 % (ref 11.5–15.5)
WBC: 3.9 10*3/uL — ABNORMAL LOW (ref 4.0–10.5)

## 2021-09-14 LAB — COMPREHENSIVE METABOLIC PANEL
ALT: 27 U/L (ref 0–53)
AST: 21 U/L (ref 0–37)
Albumin: 4.4 g/dL (ref 3.5–5.2)
Alkaline Phosphatase: 50 U/L (ref 39–117)
BUN: 13 mg/dL (ref 6–23)
CO2: 29 mEq/L (ref 19–32)
Calcium: 10.2 mg/dL (ref 8.4–10.5)
Chloride: 105 mEq/L (ref 96–112)
Creatinine, Ser: 1.03 mg/dL (ref 0.40–1.50)
GFR: 73.75 mL/min (ref >60.00)
Glucose, Bld: 117 mg/dL — ABNORMAL HIGH (ref 70–99)
Potassium: 4.9 mEq/L (ref 3.5–5.1)
Sodium: 140 mEq/L (ref 135–145)
Total Bilirubin: 0.7 mg/dL (ref 0.2–1.2)
Total Protein: 7.2 g/dL (ref 6.0–8.3)

## 2021-09-14 LAB — LIPID PANEL
Cholesterol: 120 mg/dL (ref 0–200)
HDL: 27.5 mg/dL — ABNORMAL LOW (ref >39.00)
LDL Cholesterol: 75 mg/dL (ref 0–99)
NonHDL: 92.13
Total CHOL/HDL Ratio: 4
Triglycerides: 87 mg/dL (ref 0.0–149.0)
VLDL: 17.4 mg/dL (ref 0.0–40.0)

## 2021-09-14 LAB — TSH: TSH: 1.36 u[IU]/mL (ref 0.35–5.50)

## 2021-09-14 LAB — PSA: PSA: 0.24 ng/mL (ref 0.10–4.00)

## 2021-09-14 LAB — HEMOGLOBIN A1C: Hgb A1c MFr Bld: 6.5 % (ref 4.6–6.5)

## 2021-09-14 MED ORDER — LOSARTAN POTASSIUM 100 MG PO TABS: 100.0000 mg | ORAL_TABLET | Freq: Every day | ORAL | 3 refills | Status: DC

## 2021-09-14 NOTE — Patient Instructions (Signed)
It was great seeing you today   We will follow up with you regarding your lab work   Please let me know if you need anything   

## 2021-09-23 ENCOUNTER — Other Ambulatory Visit: Payer: Self-pay | Admitting: Cardiovascular Disease

## 2021-09-30 ENCOUNTER — Other Ambulatory Visit: Payer: Self-pay | Admitting: Cardiovascular Disease

## 2021-10-31 ENCOUNTER — Ambulatory Visit: Payer: Medicare PPO | Admitting: Cardiovascular Disease

## 2021-10-31 ENCOUNTER — Encounter: Payer: Self-pay | Admitting: Cardiovascular Disease

## 2021-10-31 DIAGNOSIS — I2510 Atherosclerotic heart disease of native coronary artery without angina pectoris: Secondary | ICD-10-CM | POA: Diagnosis not present

## 2021-10-31 DIAGNOSIS — E785 Hyperlipidemia, unspecified: Secondary | ICD-10-CM

## 2021-10-31 DIAGNOSIS — I4510 Unspecified right bundle-branch block: Secondary | ICD-10-CM | POA: Diagnosis not present

## 2021-10-31 DIAGNOSIS — I5041 Acute combined systolic (congestive) and diastolic (congestive) heart failure: Secondary | ICD-10-CM | POA: Diagnosis not present

## 2021-10-31 DIAGNOSIS — I10 Essential (primary) hypertension: Secondary | ICD-10-CM

## 2021-10-31 DIAGNOSIS — E1169 Type 2 diabetes mellitus with other specified complication: Secondary | ICD-10-CM | POA: Diagnosis not present

## 2021-10-31 NOTE — Assessment & Plan Note (Signed)
History of essential hypertension blood pressure measured today at 130/70.  He is on carvedilol and losartan.

## 2021-10-31 NOTE — Assessment & Plan Note (Signed)
Chronic. 

## 2021-10-31 NOTE — Assessment & Plan Note (Signed)
Remote history of moderate LV dysfunction with an EF in the 40% range back in 2014.  His most recent 2D echo performed 04/10/2018 revealed an EF of 50 to 55%.  He is on beta-blocker and losartan.  He is asymptomatic.

## 2021-10-31 NOTE — Assessment & Plan Note (Signed)
History of hyperlipidemia on statin therapy with lipid profile performed 09/14/2021 revealing total cholesterol 120, LDL 75 and HDL 27.

## 2021-10-31 NOTE — Assessment & Plan Note (Signed)
History of CAD by cardiac catheterization performed by Dr. Martinique 04/11/2015 revealing moderate branch vessel disease with obstructive lesions and vessel is too small for PCI.  Medical therapy was recommended.  The patient is asymptomatic.

## 2021-10-31 NOTE — Progress Notes (Signed)
10/31/2021 Kyle Ballard [facility]   1950-09-09  [id number]  Primary Physician Carlisle Cater, [name], NP Primary Cardiologist: Lorretta Harp MD Kyle Ballard, [place]  HPI:  Kyle Ballard is a 71 y.o.  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 I last saw him in the office 08/30/2020...  E has a history of treated hypertension, diabetes and hyperlipidemia.  He smoked for many years but is currently trying to quit.  He had cardiac catheterization performed October 2015 revealing moderate nonobstructive CAD.  Medical therapy was recommended.  Because of accelerated angina he was admitted to the hospital 04/10/2018 and underwent diagnostic cath by Dr. Martinique revealing moderate branch vessel disease with obstructive lesions lesions and vessels too small for PCI.  His medicines were adjusted, long acting nitrate was added and medical therapy was recommended.     Since I saw him in the office a year ago he continues to do well.  He actually says that he is done better than he has in the past.  He denies chest pain, shortness of breath or palpitations.   Current Meds  Medication Sig   aspirin EC 81 MG EC tablet Take 1 tablet (81 mg total) by mouth daily.   atorvastatin (LIPITOR) 40 MG tablet TAKE 1 TABLET BY MOUTH ONCE DAILY AT 6PM. APPOINTMENT NEEDED FOR FUTURE REFILLS.   carvedilol (COREG) 6.25 MG tablet TAKE 1 TABLET BY MOUTH TWICE DAILY WITH A MEAL   CINNAMON PO Take 2 tablets by mouth once as needed.   clopidogrel (PLAVIX) 75 MG tablet Take 1 tablet by mouth once daily   fenofibrate (TRICOR) 145 MG tablet Take 1/2 (one-half) tablet by mouth once daily   isosorbide mononitrate (IMDUR) 30 MG 24 hr tablet Take 1 tablet (30 mg total) by mouth daily.   losartan (COZAAR) 100 MG tablet Take 1 tablet (100 mg total) by mouth daily.   metFORMIN (GLUCOPHAGE) 500 MG tablet Take 1 tablet (500  mg total) by mouth 2 (two) times daily with a meal.   NITROSTAT 0.4 MG SL tablet DISSOLVE ONE TABLET UNDER THE TONGUE EVERY 5 MINUTES AS NEEDED FOR CHEST PAIN. (Patient taking differently: Place 0.4 mg under the tongue every 5 (five) minutes as needed for chest pain.)     Allergies  Allergen Reactions   Lisinopril Swelling   Benadryl [Diphenhydramine Hcl] Other (See Comments)    Not really sure if has allergy; but past concern may have caused lip swelling     Social History   Socioeconomic History   Marital status: Married    Spouse name: Not on file   Number of children: 2   Years of education: Not on file   Highest education level: Not on file  Occupational History   Occupation: retired  Tobacco Use   Smoking status: Former    Packs/day: 0.25    Types: Cigarettes    Quit date: 08/2019    Years since quitting: 2.2   Smokeless tobacco: Never  Substance and Sexual Activity   Alcohol use: No    Alcohol/week: 0.0 standard drinks of alcohol   Drug use: No   Sexual activity: Not on file  Other Topics Concern   Not on file  Social History Narrative   Works at [employer]; His wife is on custodial staff at [employer]; Son in [place]   Social Determinants of Health  Financial Resource Strain: Low Risk  (01/25/2021)   Overall Financial Resource Strain (CARDIA)    Difficulty of Paying Living Expenses: Not hard at all  Food Insecurity: No Food Insecurity (01/25/2021)   Hunger Vital Sign    Worried About Running Out of Food in the Last Year: Never true    Ran Out of Food in the Last Year: Never true  Transportation Needs: No Transportation Needs (01/25/2021)   PRAPARE - Hydrologist (Medical): No    Lack of Transportation (Non-Medical): No  Physical Activity: Insufficiently Active (01/25/2021)   Exercise Vital Sign    Days of Exercise per Week: 4 days    Minutes of Exercise per Session: 20 min  Stress: No Stress Concern Present (01/25/2021)   [place]    Feeling of Stress : Only a little  Social Connections: Moderately Isolated (01/25/2021)   Social Connection and Isolation Panel [NHANES]    Frequency of Communication with Friends and Family: Three times a week    Frequency of Social Gatherings with Friends and Family: Three times a week    Attends Religious Services: Never    Active Member of Clubs or Organizations: No    Attends Archivist Meetings: Never    Marital Status: Married  Human resources officer Violence: Not At Risk (01/25/2021)   Humiliation, Afraid, Rape, and Kick questionnaire    Fear of Current or Ex-Partner: No    Emotionally Abused: No    Physically Abused: No    Sexually Abused: No     Review of Systems: General: negative for chills, fever, night sweats or weight changes.  Cardiovascular: negative for chest pain, dyspnea on exertion, edema, orthopnea, palpitations, paroxysmal nocturnal dyspnea or shortness of breath Dermatological: negative for rash Respiratory: negative for cough or wheezing Urologic: negative for hematuria Abdominal: negative for nausea, vomiting, diarrhea, bright red blood per rectum, melena, or hematemesis Neurologic: negative for visual changes, syncope, or dizziness All other systems reviewed and are otherwise negative except as noted above.    Blood pressure 130/70, pulse 61, height '5\' 9"'$  (1.753 m), weight 233 lb 9.6 oz (106 kg), SpO2 98 %.  General appearance: alert and no distress Neck: no adenopathy, no carotid bruit, no JVD, supple, symmetrical, trachea midline, and thyroid not enlarged, symmetric, no tenderness/mass/nodules Lungs: clear to auscultation bilaterally Heart: regular rate and rhythm, S1, S2 normal, no murmur, click, rub or gallop Extremities: extremities normal, atraumatic, no cyanosis or edema Pulses: 2+ and symmetric Skin: Skin color, texture, turgor normal. No rashes or lesions Neurologic:  Grossly normal  EKG sinus rhythm at 61 with bifascicular block (right bundle branch block, left anterior fascicular block).  I personally reviewed this EKG.  ASSESSMENT AND PLAN:   Hypertension History of essential hypertension blood pressure measured today at 130/70.  He is on carvedilol and losartan.  Acute combined systolic and diastolic CHF, NYHA class 1 ([place]) Remote history of moderate LV dysfunction with an EF in the 40% range back in 2014.  His most recent 2D echo performed 04/10/2018 revealed an EF of 50 to 55%.  He is on beta-blocker and losartan.  He is asymptomatic.  Hyperlipidemia associated with type 2 diabetes mellitus ([place]) History of hyperlipidemia on statin therapy with lipid profile performed 09/14/2021 revealing total cholesterol 120, LDL 75 and HDL 27.  Right bundle branch block Chronic  Coronary artery disease History of CAD by cardiac catheterization performed by Dr. Martinique 04/11/2015  revealing moderate branch vessel disease with obstructive lesions and vessel is too small for PCI.  Medical therapy was recommended.  The patient is asymptomatic.     Lorretta Harp MD FACP,FACC,FAHA, [facility] 10/31/2021 9:21 AM

## 2021-10-31 NOTE — Patient Instructions (Signed)

## 2021-12-09 ENCOUNTER — Other Ambulatory Visit: Payer: Self-pay | Admitting: Adult Health

## 2021-12-09 ENCOUNTER — Other Ambulatory Visit: Payer: Self-pay | Admitting: Cardiovascular Disease

## 2021-12-09 DIAGNOSIS — E1169 Type 2 diabetes mellitus with other specified complication: Secondary | ICD-10-CM

## 2021-12-09 DIAGNOSIS — I10 Essential (primary) hypertension: Secondary | ICD-10-CM

## 2022-01-04 DIAGNOSIS — N200 Calculus of kidney: Secondary | ICD-10-CM | POA: Diagnosis not present

## 2022-01-04 DIAGNOSIS — N400 Enlarged prostate without lower urinary tract symptoms: Secondary | ICD-10-CM | POA: Diagnosis not present

## 2022-01-04 DIAGNOSIS — R310 Gross hematuria: Secondary | ICD-10-CM | POA: Diagnosis not present

## 2022-01-28 ENCOUNTER — Ambulatory Visit: Payer: Medicare PPO

## 2022-01-30 ENCOUNTER — Ambulatory Visit (INDEPENDENT_AMBULATORY_CARE_PROVIDER_SITE_OTHER): Payer: Medicare PPO

## 2022-01-30 DIAGNOSIS — Z23 Encounter for immunization: Secondary | ICD-10-CM | POA: Diagnosis not present

## 2022-02-06 ENCOUNTER — Encounter: Payer: Self-pay | Admitting: *Deleted

## 2022-02-06 NOTE — Progress Notes (Signed)
[facility] Quality Team Note  Name: Kyle Ballard Date of Birth: 1951/01/22 MRN: [id number] Date: 02/06/2022  [facility] Quality Team has reviewed this patient's chart, please see recommendations below:  [facility] Quality Other; (Pt has open gaps for diabetic retinopathy screening and for Urine albumin creatinine ratio test.  Tried to call pt but had to leave a voice message.  )

## 2022-02-18 ENCOUNTER — Ambulatory Visit (INDEPENDENT_AMBULATORY_CARE_PROVIDER_SITE_OTHER): Payer: Medicare PPO

## 2022-02-18 VITALS — Ht 69.5 in | Wt 230.0 lb

## 2022-02-18 DIAGNOSIS — Z0000 Encounter for general adult medical examination without abnormal findings: Secondary | ICD-10-CM
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
## 2022-02-18 NOTE — Patient Instructions (Signed)
Kyle Ballard , Thank you for taking time to come for your Medicare Wellness Visit. I appreciate your ongoing commitment to your health goals. Please review the following plan we discussed and let me know if I can assist you in the future.   Screening recommendations/referrals: Colonoscopy: completed 12/13/2014 Recommended yearly ophthalmology/optometry visit for glaucoma screening and checkup Recommended yearly dental visit for hygiene and checkup  Vaccinations: Influenza vaccine: completed 01/30/2022 Pneumococcal vaccine: completed 07/16/2018 Tdap vaccine: completed 03/06/2015, due 03/05/2025 Shingles vaccine: completed   Covid-19:  02/24/2020, 08/24/2019, 07/30/2019  Advanced directives: Advance directive discussed with you today.   Conditions/risks identified: none  Next appointment: Follow up in one year for your annual wellness visit.   Preventive Care 22 Years and Older, Male Preventive care refers to lifestyle choices and visits with your health care provider that can promote health and wellness. What does preventive care include? A yearly physical exam. This is also called an annual well check. Dental exams once or twice a year. Routine eye exams. Ask your health care provider how often you should have your eyes checked. Personal lifestyle choices, including: Daily care of your teeth and gums. Regular physical activity. Eating a healthy diet. Avoiding tobacco and drug use. Limiting alcohol use. Practicing safe sex. Taking low doses of aspirin every day. Taking vitamin and mineral supplements as recommended by your health care provider. What happens during an annual well check? The services and screenings done by your health care provider during your annual well check will depend on your age, overall health, lifestyle risk factors, and family history of disease. Counseling  Your health care provider may ask you questions about your: Alcohol use. Tobacco use. Drug  use. Emotional well-being. Home and relationship well-being. Sexual activity. Eating habits. History of falls. Memory and ability to understand (cognition). Work and work Statistician. Screening  You may have the following tests or measurements: Height, weight, and BMI. Blood pressure. Lipid and cholesterol levels. These may be checked every 5 years, or more frequently if you are over 34 years old. Skin check. Lung cancer screening. You may have this screening every year starting at age 16 if you have a 30-pack-year history of smoking and currently smoke or have quit within the past 15 years. Fecal occult blood test (FOBT) of the stool. You may have this test every year starting at age 38. Flexible sigmoidoscopy or colonoscopy. You may have a sigmoidoscopy every 5 years or a colonoscopy every 10 years starting at age 59. Prostate cancer screening. Recommendations will vary depending on your family history and other risks. Hepatitis C blood test. Hepatitis B blood test. Sexually transmitted disease (STD) testing. Diabetes screening. This is done by checking your blood sugar (glucose) after you have not eaten for a while (fasting). You may have this done every 1-3 years. Abdominal aortic aneurysm (AAA) screening. You may need this if you are a current or former smoker. Osteoporosis. You may be screened starting at age 73 if you are at high risk. Talk with your health care provider about your test results, treatment options, and if necessary, the need for more tests. Vaccines  Your health care provider may recommend certain vaccines, such as: Influenza vaccine. This is recommended every year. Tetanus, diphtheria, and acellular pertussis (Tdap, Td) vaccine. You may need a Td booster every 10 years. Zoster vaccine. You may need this after age 64. Pneumococcal 13-valent conjugate (PCV13) vaccine. One dose is recommended after age 37. Pneumococcal polysaccharide (PPSV23) vaccine. One dose is  recommended after age 63. Talk to your health care provider about which screenings and vaccines you need and how often you need them. This information is not intended to replace advice given to you by your health care provider. Make sure you discuss any questions you have with your health care provider. Document Released: 05/19/2015 Document Revised: 01/10/2016 Document Reviewed: 02/21/2015 Elsevier Interactive Patient Education  2017 [place] Prevention in the Home Falls can cause injuries. They can happen to people of all ages. There are many things you can do to make your home safe and to help prevent falls. What can I do on the outside of my home? Regularly fix the edges of walkways and driveways and fix any cracks. Remove anything that might make you trip as you walk through a door, such as a raised step or threshold. Trim any bushes or trees on the path to your home. Use bright outdoor lighting. Clear any walking paths of anything that might make someone trip, such as rocks or tools. Regularly check to see if handrails are loose or broken. Make sure that both sides of any steps have handrails. Any raised decks and porches should have guardrails on the edges. Have any leaves, snow, or ice cleared regularly. Use sand or salt on walking paths during winter. Clean up any spills in your garage right away. This includes oil or grease spills. What can I do in the bathroom? Use night lights. Install grab bars by the toilet and in the tub and shower. Do not use towel bars as grab bars. Use non-skid mats or decals in the tub or shower. If you need to sit down in the shower, use a plastic, non-slip stool. Keep the floor dry. Clean up any water that spills on the floor as soon as it happens. Remove soap buildup in the tub or shower regularly. Attach bath mats securely with double-sided non-slip rug tape. Do not have throw rugs and other things on the floor that can make you  trip. What can I do in the bedroom? Use night lights. Make sure that you have a light by your bed that is easy to reach. Do not use any sheets or blankets that are too big for your bed. They should not hang down onto the floor. Have a firm chair that has side arms. You can use this for support while you get dressed. Do not have throw rugs and other things on the floor that can make you trip. What can I do in the kitchen? Clean up any spills right away. Avoid walking on wet floors. Keep items that you use a lot in easy-to-reach places. If you need to reach something above you, use a strong step stool that has a grab bar. Keep electrical cords out of the way. Do not use floor polish or wax that makes floors slippery. If you must use wax, use non-skid floor wax. Do not have throw rugs and other things on the floor that can make you trip. What can I do with my stairs? Do not leave any items on the stairs. Make sure that there are handrails on both sides of the stairs and use them. Fix handrails that are broken or loose. Make sure that handrails are as long as the stairways. Check any carpeting to make sure that it is firmly attached to the stairs. Fix any carpet that is loose or worn. Avoid having throw rugs at the top or bottom of the stairs. If you  do have throw rugs, attach them to the floor with carpet tape. Make sure that you have a light switch at the top of the stairs and the bottom of the stairs. If you do not have them, ask someone to add them for you. What else can I do to help prevent falls? Wear shoes that: Do not have high heels. Have rubber bottoms. Are comfortable and fit you well. Are closed at the toe. Do not wear sandals. If you use a stepladder: Make sure that it is fully opened. Do not climb a closed stepladder. Make sure that both sides of the stepladder are locked into place. Ask someone to hold it for you, if possible. Clearly mark and make sure that you can  see: Any grab bars or handrails. First and last steps. Where the edge of each step is. Use tools that help you move around (mobility aids) if they are needed. These include: Canes. Walkers. Scooters. Crutches. Turn on the lights when you go into a dark area. Replace any light bulbs as soon as they burn out. Set up your furniture so you have a clear path. Avoid moving your furniture around. If any of your floors are uneven, fix them. If there are any pets around you, be aware of where they are. Review your medicines with your doctor. Some medicines can make you feel dizzy. This can increase your chance of falling. Ask your doctor what other things that you can do to help prevent falls. This information is not intended to replace advice given to you by your health care provider. Make sure you discuss any questions you have with your health care provider. Document Released: 02/16/2009 Document Revised: 09/28/2015 Document Reviewed: 05/27/2014 Elsevier Interactive Patient Education  2017 [employer].

## 2022-03-05 ENCOUNTER — Telehealth: Payer: Self-pay

## 2022-03-05 NOTE — Telephone Encounter (Signed)
From [facility]: Pt has open gaps for diabetic retinopathy screening and for Urine albumin creatinine ratio test.  Tried to call pt but had to leave a voice message.  LVM for pt to return call to schedule 6 month f/u & see if he wants to do retinopathy screening 03/08/22. If pt returns call, please schedule appt & let Garnette Czech know if interested in 03/08/22 event.

## 2022-03-06 ENCOUNTER — Telehealth: Payer: Self-pay | Admitting: Adult Health

## 2022-03-06 NOTE — Telephone Encounter (Signed)
Spouse called to inquire about the status of the FMLA papers that were supposed to be completed and sent to the [employer].   Pt & Spouse were informed that NP has been out of the office, and is expected to return on 03/07/22.

## 2022-03-07 NOTE — Telephone Encounter (Signed)
Spoke to pt spouse and advised that [name] is working on form after being back from vacation. Pt spouse verbalized understanding.

## 2022-03-08 LAB — HM DIABETES EYE EXAM

## 2022-03-08 NOTE — Telephone Encounter (Signed)
Pt notified of gaps. States he will walk in for eye exam today. 6 month f/u appt scheduled; plan to get urine testing then.  Of note: also scheduled CPE in May 2024.

## 2022-03-08 NOTE — Telephone Encounter (Signed)
Pt informed ppw was faxed and copy ready for pick. Up.

## 2022-03-11 ENCOUNTER — Encounter: Payer: Self-pay | Admitting: Cardiovascular Disease

## 2022-03-11 ENCOUNTER — Encounter: Payer: Self-pay | Admitting: Adult Health

## 2022-03-11 MED ORDER — CARVEDILOL 6.25 MG PO TABS: 6.2500 mg | ORAL_TABLET | Freq: Two times a day (BID) | ORAL | 1 refills | Status: DC

## 2022-03-15 ENCOUNTER — Encounter: Payer: Self-pay | Admitting: Adult Health

## 2022-03-15 ENCOUNTER — Ambulatory Visit: Payer: Medicare PPO | Admitting: Adult Health

## 2022-03-15 VITALS — BP 110/80 | HR 55 | Temp 98.0°F | Ht 69.5 in | Wt 233.0 lb

## 2022-03-15 DIAGNOSIS — I10 Essential (primary) hypertension: Secondary | ICD-10-CM

## 2022-03-15 DIAGNOSIS — E1169 Type 2 diabetes mellitus with other specified complication: Secondary | ICD-10-CM

## 2022-03-15 LAB — MICROALBUMIN / CREATININE URINE RATIO
Creatinine,U: 90 mg/dL
Microalb Creat Ratio: 1.8 mg/g (ref 0.0–30.0)
Microalb, Ur: 1.7 mg/dL (ref 0.0–1.9)

## 2022-03-15 LAB — POCT GLYCOSYLATED HEMOGLOBIN (HGB A1C): Hemoglobin A1C: 6 % — AB (ref 4.0–5.6)

## 2022-03-15 MED ORDER — METFORMIN HCL 500 MG PO TABS: 500.0000 mg | ORAL_TABLET | Freq: Two times a day (BID) | ORAL | 1 refills | Status: DC
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
## 2022-04-12 ENCOUNTER — Other Ambulatory Visit: Payer: Self-pay | Admitting: Cardiovascular Disease

## 2022-04-30 ENCOUNTER — Other Ambulatory Visit: Payer: Self-pay | Admitting: Cardiovascular Disease

## 2022-04-30 DIAGNOSIS — I10 Essential (primary) hypertension: Secondary | ICD-10-CM

## 2022-04-30 DIAGNOSIS — E1169 Type 2 diabetes mellitus with other specified complication: Secondary | ICD-10-CM

## 2022-07-11 ENCOUNTER — Other Ambulatory Visit: Payer: Self-pay | Admitting: Cardiovascular Disease

## 2022-09-17 ENCOUNTER — Ambulatory Visit (INDEPENDENT_AMBULATORY_CARE_PROVIDER_SITE_OTHER): Payer: Medicare PPO | Admitting: Adult Health

## 2022-09-17 ENCOUNTER — Encounter: Payer: Self-pay | Admitting: Adult Health

## 2022-09-17 VITALS — BP 136/80 | HR 59 | Temp 97.8°F | Ht 68.0 in | Wt 233.0 lb

## 2022-09-17 DIAGNOSIS — E1169 Type 2 diabetes mellitus with other specified complication: Secondary | ICD-10-CM

## 2022-09-17 DIAGNOSIS — Z125 Encounter for screening for malignant neoplasm of prostate: Secondary | ICD-10-CM

## 2022-09-17 DIAGNOSIS — E785 Hyperlipidemia, unspecified: Secondary | ICD-10-CM

## 2022-09-17 DIAGNOSIS — I2510 Atherosclerotic heart disease of native coronary artery without angina pectoris: Secondary | ICD-10-CM

## 2022-09-17 DIAGNOSIS — Z0000 Encounter for general adult medical examination without abnormal findings: Secondary | ICD-10-CM | POA: Diagnosis not present

## 2022-09-17 DIAGNOSIS — Z7984 Long term (current) use of oral hypoglycemic drugs: Secondary | ICD-10-CM

## 2022-09-17 DIAGNOSIS — I10 Essential (primary) hypertension: Secondary | ICD-10-CM | POA: Diagnosis not present

## 2022-09-17 LAB — CBC
HCT: 39.7 % (ref 39.0–52.0)
Hemoglobin: 13.6 g/dL (ref 13.0–17.0)
MCHC: 34.3 g/dL (ref 30.0–36.0)
MCV: 89.8 fl (ref 78.0–100.0)
Platelets: 275 10*3/uL (ref 150.0–400.0)
RBC: 4.42 Mil/uL (ref 4.22–5.81)
RDW: 14.2 % (ref 11.5–15.5)
WBC: 4.1 10*3/uL (ref 4.0–10.5)

## 2022-09-17 LAB — COMPREHENSIVE METABOLIC PANEL
ALT: 23 U/L (ref 0–53)
AST: 20 U/L (ref 0–37)
Albumin: 4.3 g/dL (ref 3.5–5.2)
Alkaline Phosphatase: 48 U/L (ref 39–117)
BUN: 13 mg/dL (ref 6–23)
CO2: 26 mEq/L (ref 19–32)
Calcium: 10 mg/dL (ref 8.4–10.5)
Chloride: 105 mEq/L (ref 96–112)
Creatinine, Ser: 0.93 mg/dL (ref 0.40–1.50)
GFR: 82.77 mL/min (ref >60.00)
Glucose, Bld: 96 mg/dL (ref 70–99)
Potassium: 4.3 mEq/L (ref 3.5–5.1)
Sodium: 140 mEq/L (ref 135–145)
Total Bilirubin: 0.8 mg/dL (ref 0.2–1.2)
Total Protein: 7.3 g/dL (ref 6.0–8.3)

## 2022-09-17 LAB — PSA: PSA: 0.32 ng/mL (ref 0.10–4.00)

## 2022-09-17 LAB — LIPID PANEL
Cholesterol: 126 mg/dL (ref 0–200)
HDL: 28 mg/dL — ABNORMAL LOW (ref >39.00)
LDL Cholesterol: 75 mg/dL (ref 0–99)
NonHDL: 97.95
Total CHOL/HDL Ratio: 4
Triglycerides: 117 mg/dL (ref 0.0–149.0)
VLDL: 23.4 mg/dL (ref 0.0–40.0)

## 2022-09-17 LAB — TSH: TSH: 1.09 u[IU]/mL (ref 0.35–5.50)

## 2022-09-17 LAB — MICROALBUMIN / CREATININE URINE RATIO
Creatinine,U: 155.2 mg/dL
Microalb Creat Ratio: 2.3 mg/g (ref 0.0–30.0)
Microalb, Ur: 3.5 mg/dL — ABNORMAL HIGH (ref 0.0–1.9)

## 2022-09-17 LAB — HEMOGLOBIN A1C: Hgb A1c MFr Bld: 6.8 % — ABNORMAL HIGH (ref 4.6–6.5)

## 2022-09-17 MED ORDER — LOSARTAN POTASSIUM 100 MG PO TABS: 100.0000 mg | ORAL_TABLET | Freq: Every day | ORAL | 3 refills | Status: DC

## 2022-09-17 MED ORDER — METFORMIN HCL 500 MG PO TABS
500.0000 mg | ORAL_TABLET | Freq: Two times a day (BID) | ORAL | 1 refills | Status: DC
Start: 2022-09-17 — End: 2023-09-19

## 2022-09-17 NOTE — Patient Instructions (Signed)
It was great seeing you today   We will follow up with you regarding your lab work   Please let me know if you need anything   
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
## 2022-11-05 ENCOUNTER — Telehealth: Payer: Self-pay | Admitting: Adult Health

## 2022-11-05 NOTE — Telephone Encounter (Signed)
Pt wife want a call back when the FMLA paper work is fill out because she have some questions.

## 2022-11-08 NOTE — Telephone Encounter (Signed)
Left message to return phone call.

## 2022-11-08 NOTE — Telephone Encounter (Signed)
Patient returned call

## 2022-11-08 NOTE — Telephone Encounter (Signed)
Spoke to pt spouse and she stated that she would like more day off than listed. Pt spouse stated that pt health is declining.  PPW placed on providers desk. Pt notified that Kandee Keen is out of the office until 11/12/2022 and verbalized understanding.

## 2022-11-13 NOTE — Telephone Encounter (Signed)
Spoke to pt spouse and she stated its ok keep th same amount of days as last year.

## 2022-11-13 NOTE — Telephone Encounter (Signed)
Left message to return phone call.

## 2022-11-14 NOTE — Telephone Encounter (Signed)
FMLA faxed with confirmation. No further action needed.

## 2022-11-22 ENCOUNTER — Other Ambulatory Visit: Payer: Self-pay | Admitting: Cardiovascular Disease

## 2022-12-20 ENCOUNTER — Ambulatory Visit: Payer: Medicare PPO | Admitting: Adult Health

## 2022-12-20 ENCOUNTER — Telehealth: Payer: Self-pay | Admitting: Adult Health

## 2022-12-20 ENCOUNTER — Encounter: Payer: Self-pay | Admitting: Adult Health

## 2022-12-20 VITALS — BP 120/74 | HR 57 | Temp 97.9°F | Ht 68.0 in | Wt 225.2 lb

## 2022-12-20 DIAGNOSIS — E119 Type 2 diabetes mellitus without complications: Secondary | ICD-10-CM | POA: Diagnosis not present

## 2022-12-20 DIAGNOSIS — I10 Essential (primary) hypertension: Secondary | ICD-10-CM

## 2022-12-20 DIAGNOSIS — H6991 Unspecified Eustachian tube disorder, right ear: Secondary | ICD-10-CM

## 2022-12-20 DIAGNOSIS — Z7984 Long term (current) use of oral hypoglycemic drugs: Secondary | ICD-10-CM

## 2022-12-20 LAB — MICROALBUMIN / CREATININE URINE RATIO
Creatinine,U: 66 mg/dL
Microalb Creat Ratio: 1.1 mg/g (ref 0.0–30.0)
Microalb, Ur: <0.7 mg/dL (ref 0.0–1.9)

## 2022-12-20 LAB — BASIC METABOLIC PANEL
BUN: 13 mg/dL (ref 6–23)
CO2: 25 meq/L (ref 19–32)
Calcium: 10.5 mg/dL (ref 8.4–10.5)
Chloride: 98 meq/L (ref 96–112)
Creatinine, Ser: 0.9 mg/dL (ref 0.40–1.50)
GFR: 85.94 mL/min (ref >60.00)
Glucose, Bld: 96 mg/dL (ref 70–99)
Potassium: 4.2 meq/L (ref 3.5–5.1)
Sodium: 128 meq/L — ABNORMAL LOW (ref 135–145)

## 2022-12-20 NOTE — Telephone Encounter (Signed)
Spoke to patient.   Pt reports he had used Afrin last time and not flonase after checking his med at home.   Pt would like to know if he can use Afrin or should he still get flonase. Please advise.
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
## 2022-12-20 NOTE — Telephone Encounter (Signed)
Pt was just seen by NP.  Pt forgot the name of the medication NP told him to check and see if he had any left. Pt would like a call back, with the name of the Rx is needs to check.

## 2022-12-23 LAB — HEMOGLOBIN A1C: Hgb A1c MFr Bld: 6.3 % (ref 4.6–6.5)

## 2022-12-23 NOTE — Telephone Encounter (Signed)
Attempted to reach pt. Left a voicemail to call us back.  

## 2022-12-23 NOTE — Telephone Encounter (Signed)
Pt returned call

## 2022-12-24 NOTE — Telephone Encounter (Signed)
Patient notified of update  and verbalized understanding. 

## 2023-01-01 ENCOUNTER — Other Ambulatory Visit: Payer: Self-pay | Admitting: Cardiovascular Disease

## 2023-01-03 ENCOUNTER — Other Ambulatory Visit: Payer: Self-pay | Admitting: Cardiovascular Disease

## 2023-01-13 ENCOUNTER — Other Ambulatory Visit: Payer: Self-pay

## 2023-01-13 ENCOUNTER — Encounter: Payer: Self-pay | Admitting: Cardiovascular Disease

## 2023-01-13 MED ORDER — ISOSORBIDE MONONITRATE ER 30 MG PO TB24: 30.0000 mg | ORAL_TABLET | Freq: Every day | ORAL | 0 refills | Status: DC

## 2023-01-29 DIAGNOSIS — N200 Calculus of kidney: Secondary | ICD-10-CM | POA: Diagnosis not present

## 2023-02-03 ENCOUNTER — Other Ambulatory Visit: Payer: Self-pay | Admitting: Cardiovascular Disease

## 2023-02-26 ENCOUNTER — Ambulatory Visit: Payer: Medicare PPO | Attending: Cardiovascular Disease | Admitting: Cardiovascular Disease

## 2023-02-26 ENCOUNTER — Encounter: Payer: Self-pay | Admitting: Cardiovascular Disease

## 2023-02-26 VITALS — BP 122/80 | HR 56 | Ht 68.0 in | Wt 226.0 lb

## 2023-02-26 DIAGNOSIS — I5041 Acute combined systolic (congestive) and diastolic (congestive) heart failure: Secondary | ICD-10-CM

## 2023-02-26 DIAGNOSIS — R079 Chest pain, unspecified: Secondary | ICD-10-CM | POA: Diagnosis not present

## 2023-02-26 DIAGNOSIS — I2510 Atherosclerotic heart disease of native coronary artery without angina pectoris: Secondary | ICD-10-CM | POA: Diagnosis not present

## 2023-02-26 DIAGNOSIS — I4510 Unspecified right bundle-branch block: Secondary | ICD-10-CM

## 2023-02-26 DIAGNOSIS — E785 Hyperlipidemia, unspecified: Secondary | ICD-10-CM

## 2023-02-26 DIAGNOSIS — E1169 Type 2 diabetes mellitus with other specified complication: Secondary | ICD-10-CM | POA: Diagnosis not present

## 2023-02-26 DIAGNOSIS — I10 Essential (primary) hypertension: Secondary | ICD-10-CM

## 2023-02-26 DIAGNOSIS — Z720 Tobacco use: Secondary | ICD-10-CM | POA: Diagnosis not present

## 2023-02-26 NOTE — Patient Instructions (Addendum)

## 2023-02-26 NOTE — Assessment & Plan Note (Signed)
Chronic. 

## 2023-02-26 NOTE — Progress Notes (Signed)
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 I last saw him in the office 10/31/2021.  He has a history of treated hypertension, diabetes and hyperlipidemia.  He smoked for many years but is currently trying to quit.  He had cardiac catheterization performed October 2015 revealing moderate nonobstructive CAD.  Medical therapy was recommended.  Because of accelerated angina he was admitted to the hospital 04/10/2018 and underwent diagnostic cath by Dr. Swaziland revealing moderate branch vessel disease with obstructive lesions lesions and vessels too small for PCI.  His medicines were adjusted, long acting nitrate was added and medical therapy was recommended.     Since I saw him in the office a year ago he continues to do well.  He actually says that he is done better than he has in the past.  He denies chest pain, shortness of breath or palpitations.   Current Meds  Medication Sig   aspirin EC 81 MG EC tablet Take 1 tablet (81 mg total) by mouth daily.   atorvastatin (LIPITOR) 40 MG tablet Take 1 tablet (40 mg total) by mouth daily.   carvedilol (COREG) 6.25 MG tablet TAKE 1 TABLET BY MOUTH TWICE DAILY WITH A MEAL   CINNAMON PO Take 2 tablets by mouth once as needed.   clopidogrel (PLAVIX) 75 MG tablet Take 1 tablet by mouth once daily   fenofibrate (TRICOR) 145 MG tablet Take 1/2 (one-half) tablet by mouth once daily   isosorbide mononitrate (IMDUR) 30 MG 24 hr tablet Take 1 tablet by mouth once daily   losartan (COZAAR) 100 MG tablet Take 1 tablet (100 mg total) by mouth daily.   metFORMIN (GLUCOPHAGE) 500 MG tablet Take 1 tablet (500 mg total) by mouth 2 (two) times daily with a  meal.   NITROSTAT 0.4 MG SL tablet DISSOLVE ONE TABLET UNDER THE TONGUE EVERY 5 MINUTES AS NEEDED FOR CHEST PAIN. (Patient taking differently: Place 0.4 mg under the tongue every 5 (five) minutes as needed for chest pain.)     Allergies  Allergen Reactions   Lisinopril Swelling   Benadryl [Diphenhydramine Hcl] Other (See Comments)    Not really sure if has allergy; but past concern may have caused lip swelling     Social History   Socioeconomic History   Marital status: Married    Spouse name: Not on file   Number of children: 2   Years of education: Not on file   Highest education level: Not on file  Occupational History   Occupation: retired  Tobacco Use   Smoking status: Former    Current packs/day: 0.00    Types: Cigarettes    Quit date: 08/2019    Years since quitting: 3.5   Smokeless tobacco: Never  Vaping Use   Vaping status: Never Used  Substance and Sexual Activity   Alcohol use: No    Alcohol/week: 0.0 standard drinks of alcohol   Drug use: No   Sexual activity: Not on file  Other Topics Concern   Not on file  Social History Narrative   Works at [employer]; His wife is on custodial staff at [employer]; Son in [place]   Social Determinants of Health  Financial Resource Strain: Low Risk  (02/18/2022)   Overall Financial Resource Strain (CARDIA)    Difficulty of Paying Living Expenses: Not hard at all  Food Insecurity: No Food Insecurity (02/18/2022)   Hunger Vital Sign    Worried About Running Out of Food in the Last Year: Never true    Ran Out of Food in the Last Year: Never true  Transportation Needs: No Transportation Needs (02/18/2022)   PRAPARE - Administrator, Civil Service (Medical): No    Lack of Transportation (Non-Medical): No  Physical Activity: Sufficiently Active (02/18/2022)   Exercise Vital Sign    Days of Exercise per Week: 7 days    Minutes of Exercise per Session: 30 min  Stress: No Stress Concern Present (02/18/2022)   [employer] of Occupational Health - Occupational Stress Questionnaire    Feeling of Stress : Not at all  Social Connections: Moderately Isolated (01/25/2021)   Social Connection and Isolation Panel [NHANES]    Frequency of Communication with Friends and Family: Three times a week    Frequency of Social Gatherings with Friends and Family: Three times a week    Attends Religious Services: Never    Active Member of Clubs or Organizations: No    Attends Banker Meetings: Never    Marital Status: Married  Catering manager Violence: Not At Risk (01/25/2021)   Humiliation, Afraid, Rape, and Kick questionnaire    Fear of Current or Ex-Partner: No    Emotionally Abused: No    Physically Abused: No    Sexually Abused: No     Review of Systems: General: negative for chills, fever, night sweats or weight changes.  Cardiovascular: negative for chest pain, dyspnea on exertion, edema, orthopnea, palpitations, paroxysmal nocturnal dyspnea or shortness of breath Dermatological: negative for rash Respiratory: negative for cough or wheezing Urologic: negative for hematuria Abdominal: negative for nausea, vomiting, diarrhea, bright red blood per rectum, melena, or hematemesis Neurologic: negative for visual changes, syncope, or dizziness All other systems reviewed and are otherwise negative except as noted above.    Blood pressure 122/80, pulse (!) 56, height 5\' 8"  (1.727 m), weight 226 lb (102.5 kg).  General appearance: alert and no distress Neck: no adenopathy, no carotid bruit, no JVD, supple, symmetrical, trachea midline, and thyroid not enlarged, symmetric, no tenderness/mass/nodules Lungs: clear to auscultation bilaterally Heart: regular rate and rhythm, S1, S2 normal, no murmur, click, rub or gallop Extremities: extremities normal, atraumatic, no cyanosis or edema Pulses: 2+ and symmetric Skin: Skin color, texture, turgor normal. No rashes or lesions Neurologic: Grossly  normal  EKG EKG Interpretation Date/Time:  Wednesday February 26 2023 10:09:17 EDT Ventricular Rate:  56 PR Interval:  190 QRS Duration:  142 QT Interval:  466 QTC Calculation: 449 R Axis:   -41  Text Interpretation: Sinus bradycardia with Premature supraventricular complexes Left axis deviation Right bundle branch block When compared with ECG of 11-Apr-2018 06:29, Premature supraventricular complexes are now Present Sinus rhythm is no longer with 2nd degree A-V block (Mobitz II) Right bundle branch block is now Present Confirmed by Nanetta Batty [phone]) on 02/26/2023 10:37:36 AM    ASSESSMENT AND PLAN:   Hypertension History of essential hypertension blood pressure measured today at 122/80.  He is on carvedilol and losartan.  Hyperlipidemia associated with type 2 diabetes mellitus (HCC) History of hyperlipidemia on statin therapy as well as fenofibrate with lipid profile performed 09/17/2018 until cholesterol 126, LDL 75 and HDL 28.  Tobacco abuse  Long history of tobacco use having quit 45 years ago at the time of his second cardiac catheterization.  Right bundle branch block Chronic  Coronary artery disease History of CAD status post cardiac catheterization performed October 2015 revealing moderate nonobstructive CAD.  He had second catheterization performed by Dr. Swaziland 04/10/2018 revealing moderate branch vessel disease with vessels that were too small for PCI.  Medical therapy was recommended.  Since I saw him a year ago he has been pain-free.     Runell Gess MD FACP,FACC,FAHA, [facility] 02/26/2023 10:45 AM

## 2023-02-26 NOTE — Assessment & Plan Note (Signed)
History of essential hypertension blood pressure measured today at 122/80.  He is on carvedilol and losartan.

## 2023-02-26 NOTE — Assessment & Plan Note (Signed)
Long history of tobacco use having quit 45 years ago at the time of his second cardiac catheterization.

## 2023-02-26 NOTE — Assessment & Plan Note (Signed)
History of hyperlipidemia on statin therapy as well as fenofibrate with lipid profile performed 09/17/2018 until cholesterol 126, LDL 75 and HDL 28.

## 2023-02-26 NOTE — Assessment & Plan Note (Signed)
History of CAD status post cardiac catheterization performed October 2015 revealing moderate nonobstructive CAD.  He had second catheterization performed by Dr. Swaziland 04/10/2018 revealing moderate branch vessel disease with vessels that were too small for PCI.  Medical therapy was recommended.  Since I saw him a year ago he has been pain-free.

## 2023-03-03 ENCOUNTER — Other Ambulatory Visit: Payer: Self-pay | Admitting: Cardiovascular Disease

## 2023-03-30 ENCOUNTER — Other Ambulatory Visit: Payer: Self-pay | Admitting: Cardiovascular Disease

## 2023-04-13 ENCOUNTER — Other Ambulatory Visit: Payer: Self-pay | Admitting: Cardiovascular Disease

## 2023-05-14 ENCOUNTER — Other Ambulatory Visit: Payer: Self-pay | Admitting: Cardiovascular Disease

## 2023-07-31 ENCOUNTER — Ambulatory Visit: Admitting: Adult Health

## 2023-07-31 VITALS — BP 130/82 | HR 68 | Temp 97.7°F | Ht 68.0 in | Wt 232.0 lb

## 2023-07-31 DIAGNOSIS — J020 Streptococcal pharyngitis: Secondary | ICD-10-CM

## 2023-07-31 DIAGNOSIS — Z7984 Long term (current) use of oral hypoglycemic drugs: Secondary | ICD-10-CM | POA: Diagnosis not present

## 2023-07-31 DIAGNOSIS — E119 Type 2 diabetes mellitus without complications: Secondary | ICD-10-CM | POA: Diagnosis not present

## 2023-07-31 DIAGNOSIS — I10 Essential (primary) hypertension: Secondary | ICD-10-CM | POA: Diagnosis not present

## 2023-07-31 LAB — POCT GLYCOSYLATED HEMOGLOBIN (HGB A1C): Hemoglobin A1C: 6.1 % — AB (ref 4.0–5.6)

## 2023-07-31 LAB — POCT RAPID STREP A (OFFICE): Rapid Strep A Screen: POSITIVE — AB

## 2023-07-31 MED ORDER — AMOXICILLIN 500 MG PO CAPS: 500.0000 mg | ORAL_CAPSULE | Freq: Two times a day (BID) | ORAL | 0 refills | Status: AC
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
## 2023-07-31 NOTE — Patient Instructions (Signed)
 Health Maintenance Due  Topic Date Due   FOOT EXAM  09/15/2022   INFLUENZA VACCINE  12/05/2022   COVID-19 Vaccine (4 - 2024-25 season) 01/05/2023   Medicare Annual Wellness (AWV)  02/19/2023   OPHTHALMOLOGY EXAM  03/09/2023   HEMOGLOBIN A1C  06/22/2023       09/17/2022    8:57 AM 02/18/2022   10:39 AM 04/04/2021    7:59 AM  Depression screen PHQ 2/9  Decreased Interest 0 0 0  Down, Depressed, Hopeless 0 0 0  PHQ - 2 Score 0 0 0  Altered sleeping 0    Tired, decreased energy 0    Change in appetite 0    Feeling bad or failure about yourself  0    Trouble concentrating 0    Moving slowly or fidgety/restless 0    Suicidal thoughts 0    PHQ-9 Score 0

## 2023-08-11 ENCOUNTER — Other Ambulatory Visit: Payer: Self-pay | Admitting: Cardiovascular Disease

## 2023-09-01 ENCOUNTER — Telehealth: Payer: Self-pay | Admitting: Cardiovascular Disease

## 2023-09-01 ENCOUNTER — Other Ambulatory Visit: Payer: Self-pay | Admitting: Adult Health

## 2023-09-01 DIAGNOSIS — I10 Essential (primary) hypertension: Secondary | ICD-10-CM

## 2023-09-01 DIAGNOSIS — J020 Streptococcal pharyngitis: Secondary | ICD-10-CM

## 2023-09-01 DIAGNOSIS — E1169 Type 2 diabetes mellitus with other specified complication: Secondary | ICD-10-CM

## 2023-09-01 MED ORDER — CLOPIDOGREL BISULFATE 75 MG PO TABS: 75.0000 mg | ORAL_TABLET | Freq: Every day | ORAL | 1 refills | Status: DC

## 2023-09-01 NOTE — Telephone Encounter (Signed)
*  STAT* If patient is at the pharmacy, call can be transferred to refill team.   1. Which medications need to be refilled? (please list name of each medication and dose if known)   clopidogrel  (PLAVIX ) 75 MG tablet   2. Would you like to learn more about the convenience, safety, & potential cost savings by using the [facility]?   3. Are you open to using the [facility] (Type [facility]. ).  4. Which pharmacy/location (including street and city if local pharmacy) is medication to be sent to?  [facility] [address], [place] - [address]   5. Do they need a 30 day or 90 day supply? 90 day  Patient stated he has 1-2 tablets left.

## 2023-09-01 NOTE — Telephone Encounter (Signed)
 Pt's medication was sent to pt's pharmacy as requested. Confirmation received.

## 2023-09-12 ENCOUNTER — Encounter (HOSPITAL_COMMUNITY): Payer: Self-pay

## 2023-09-19 ENCOUNTER — Encounter: Payer: Self-pay | Admitting: Adult Health

## 2023-09-19 ENCOUNTER — Other Ambulatory Visit: Payer: Self-pay | Admitting: Cardiovascular Disease

## 2023-09-19 ENCOUNTER — Ambulatory Visit (INDEPENDENT_AMBULATORY_CARE_PROVIDER_SITE_OTHER): Admitting: Adult Health

## 2023-09-19 VITALS — BP 120/84 | HR 79 | Temp 98.0°F | Ht 67.15 in | Wt 232.0 lb

## 2023-09-19 DIAGNOSIS — E1169 Type 2 diabetes mellitus with other specified complication: Secondary | ICD-10-CM | POA: Diagnosis not present

## 2023-09-19 DIAGNOSIS — E119 Type 2 diabetes mellitus without complications: Secondary | ICD-10-CM

## 2023-09-19 DIAGNOSIS — E785 Hyperlipidemia, unspecified: Secondary | ICD-10-CM

## 2023-09-19 DIAGNOSIS — N401 Enlarged prostate with lower urinary tract symptoms: Secondary | ICD-10-CM | POA: Diagnosis not present

## 2023-09-19 DIAGNOSIS — I2510 Atherosclerotic heart disease of native coronary artery without angina pectoris: Secondary | ICD-10-CM | POA: Diagnosis not present

## 2023-09-19 DIAGNOSIS — Z7984 Long term (current) use of oral hypoglycemic drugs: Secondary | ICD-10-CM | POA: Diagnosis not present

## 2023-09-19 DIAGNOSIS — Z0000 Encounter for general adult medical examination without abnormal findings: Secondary | ICD-10-CM | POA: Diagnosis not present

## 2023-09-19 DIAGNOSIS — I5041 Acute combined systolic (congestive) and diastolic (congestive) heart failure: Secondary | ICD-10-CM | POA: Diagnosis not present

## 2023-09-19 DIAGNOSIS — R351 Nocturia: Secondary | ICD-10-CM

## 2023-09-19 DIAGNOSIS — I10 Essential (primary) hypertension: Secondary | ICD-10-CM

## 2023-09-19 LAB — CBC
HCT: 38 % — ABNORMAL LOW (ref 39.0–52.0)
Hemoglobin: 12.9 g/dL — ABNORMAL LOW (ref 13.0–17.0)
MCHC: 34 g/dL (ref 30.0–36.0)
MCV: 90 fl (ref 78.0–100.0)
Platelets: 293 10*3/uL (ref 150.0–400.0)
RBC: 4.22 Mil/uL (ref 4.22–5.81)
RDW: 14.1 % (ref 11.5–15.5)
WBC: 3.6 10*3/uL — ABNORMAL LOW (ref 4.0–10.5)

## 2023-09-19 LAB — LIPID PANEL
Cholesterol: 133 mg/dL (ref 0–200)
HDL: 32 mg/dL — ABNORMAL LOW (ref >39.00)
LDL Cholesterol: 84 mg/dL (ref 0–99)
NonHDL: 101.47
Total CHOL/HDL Ratio: 4
Triglycerides: 85 mg/dL (ref 0.0–149.0)
VLDL: 17 mg/dL (ref 0.0–40.0)

## 2023-09-19 LAB — COMPREHENSIVE METABOLIC PANEL WITH GFR
ALT: 19 U/L (ref 0–53)
AST: 18 U/L (ref 0–37)
Albumin: 4.4 g/dL (ref 3.5–5.2)
Alkaline Phosphatase: 52 U/L (ref 39–117)
BUN: 12 mg/dL (ref 6–23)
CO2: 28 meq/L (ref 19–32)
Calcium: 9.6 mg/dL (ref 8.4–10.5)
Chloride: 105 meq/L (ref 96–112)
Creatinine, Ser: 0.85 mg/dL (ref 0.40–1.50)
GFR: 86.98 mL/min (ref >60.00)
Glucose, Bld: 128 mg/dL — ABNORMAL HIGH (ref 70–99)
Potassium: 4.4 meq/L (ref 3.5–5.1)
Sodium: 140 meq/L (ref 135–145)
Total Bilirubin: 0.7 mg/dL (ref 0.2–1.2)
Total Protein: 7.2 g/dL (ref 6.0–8.3)

## 2023-09-19 LAB — MICROALBUMIN / CREATININE URINE RATIO
Creatinine,U: 86.8 mg/dL
Microalb Creat Ratio: 12.7 mg/g (ref 0.0–30.0)
Microalb, Ur: 1.1 mg/dL (ref 0.0–1.9)

## 2023-09-19 LAB — HEMOGLOBIN A1C: Hgb A1c MFr Bld: 6.7 % — ABNORMAL HIGH (ref 4.6–6.5)

## 2023-09-19 MED ORDER — LOSARTAN POTASSIUM 100 MG PO TABS
100.0000 mg | ORAL_TABLET | Freq: Every day | ORAL | 3 refills | Status: AC
Start: 2023-09-19 — End: ?

## 2023-09-19 MED ORDER — METFORMIN HCL 500 MG PO TABS: 500.0000 mg | ORAL_TABLET | Freq: Two times a day (BID) | ORAL | 1 refills | Status: DC

## 2023-09-19 NOTE — Patient Instructions (Addendum)
 It was great seeing you today   We will follow up with you regarding your lab work   Please let me know if you need anything
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
## 2023-09-23 ENCOUNTER — Ambulatory Visit: Payer: Self-pay | Admitting: Adult Health

## 2023-09-23 LAB — PSA: PSA: 0.33 ng/mL (ref 0.10–4.00)

## 2023-09-23 LAB — TSH: TSH: 1.27 u[IU]/mL (ref 0.35–5.50)

## 2023-10-06 ENCOUNTER — Telehealth: Payer: Self-pay | Admitting: Adult Health

## 2023-10-06 NOTE — Telephone Encounter (Signed)
 Copied from CRM [id number]. Topic: General - Other >> Oct 06, 2023  3:50 PM Shardie S wrote: Reason for CRM: Patient's spouse, Eldonna Greenspan, states that FMLA paperwork was sent by [employer]. However, when the document was returned it was not completed. She is requesting to have the form completed and returned to [employer] as soon as possible. Callback # [phone].

## 2023-10-09 NOTE — Telephone Encounter (Signed)
 Pt spouse notified that FMLA PPW was filled out and faxed with confirmation. Spouse requesting copy of FMLA PPW. PPW placed in front office filing cabinet. Per spouse she will come and pick it up.

## 2023-12-07 ENCOUNTER — Other Ambulatory Visit: Payer: Self-pay | Admitting: Cardiovascular Disease

## 2024-01-12 ENCOUNTER — Ambulatory Visit (INDEPENDENT_AMBULATORY_CARE_PROVIDER_SITE_OTHER)

## 2024-01-12 VITALS — BP 122/60 | HR 60 | Temp 97.8°F | Ht 67.0 in | Wt 229.8 lb

## 2024-01-12 DIAGNOSIS — Z0000 Encounter for general adult medical examination without abnormal findings: Secondary | ICD-10-CM | POA: Diagnosis not present

## 2024-01-12 NOTE — Patient Instructions (Addendum)
 [name],  Thank you for taking the time for your Medicare Wellness Visit. I appreciate your continued commitment to your health goals. Please review the care plan we discussed, and feel free to reach out if I can assist you further.  Medicare recommends these wellness visits once per year to help you and your care team stay ahead of potential health issues. These visits are designed to focus on prevention, allowing your provider to concentrate on managing your acute and chronic conditions during your regular appointments.  Please note that Annual Wellness Visits do not include a physical exam. Some assessments may be limited, especially if the visit was conducted virtually. If needed, we may recommend a separate in-person follow-up with your provider.  Ongoing Care Seeing your primary care provider every 3 to 6 months helps us  monitor your health and provide consistent, personalized care.   Referrals If a referral was made during today's visit and you haven't received any updates within two weeks, please contact the referred provider directly to check on the status.  Recommended Screenings:  Health Maintenance  Topic Date Due   Eye exam for diabetics  03/09/2023   Flu Shot  12/05/2023   COVID-19 Vaccine (4 - 2025-26 season) 01/05/2024   Hemoglobin A1C  03/21/2024   Complete foot exam   07/30/2024   Yearly kidney function blood test for diabetes  09/18/2024   Yearly kidney health urinalysis for diabetes  09/18/2024   Colon Cancer Screening  12/12/2024   Medicare Annual Wellness Visit  01/11/2025   DTaP/Tdap/Td vaccine (2 - Td or Tdap) 03/05/2025   Pneumococcal Vaccine for age over 87  Completed   Hepatitis C Screening  Completed   Zoster (Shingles) Vaccine  Completed   HPV Vaccine  Aged Out   Meningitis B Vaccine  Aged Out       01/12/2024    9:41 AM  Advanced Directives  Does Patient Have a Medical Advance Directive? No  Would patient like information on creating a medical  advance directive? No - Patient declined   Advance Care Planning is important because it: Ensures you receive medical care that aligns with your values, goals, and preferences. Provides guidance to your family and loved ones, reducing the emotional burden of decision-making during critical moments.  Vision: Annual vision screenings are recommended for early detection of glaucoma, cataracts, and diabetic retinopathy. These exams can also reveal signs of chronic conditions such as diabetes and high blood pressure.  Dental: Annual dental screenings help detect early signs of oral cancer, gum disease, and other conditions linked to overall health, including heart disease and diabetes.  Please see the attached documents for additional preventive care recommendations.
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
## 2024-02-23 ENCOUNTER — Ambulatory Visit: Attending: Cardiovascular Disease | Admitting: Cardiovascular Disease

## 2024-02-23 ENCOUNTER — Encounter: Payer: Self-pay | Admitting: Cardiovascular Disease

## 2024-02-23 VITALS — BP 100/80 | HR 59 | Ht 70.0 in | Wt 230.0 lb

## 2024-02-23 DIAGNOSIS — I2510 Atherosclerotic heart disease of native coronary artery without angina pectoris: Secondary | ICD-10-CM

## 2024-02-23 DIAGNOSIS — I10 Essential (primary) hypertension: Secondary | ICD-10-CM

## 2024-02-23 DIAGNOSIS — I499 Cardiac arrhythmia, unspecified: Secondary | ICD-10-CM | POA: Diagnosis not present

## 2024-02-23 DIAGNOSIS — E1169 Type 2 diabetes mellitus with other specified complication: Secondary | ICD-10-CM | POA: Diagnosis not present

## 2024-02-23 DIAGNOSIS — E785 Hyperlipidemia, unspecified: Secondary | ICD-10-CM | POA: Diagnosis not present

## 2024-02-23 MED ORDER — ATORVASTATIN CALCIUM 80 MG PO TABS: 80.0000 mg | ORAL_TABLET | Freq: Every day | ORAL | 3 refills | Status: AC

## 2024-02-23 NOTE — Patient Instructions (Signed)
 Medication Instructions:  Your physician has recommended you make the following change in your medication:   -Increase atorvastatin  (lipitor) to 80mg  once daily.  *If you need a refill on your cardiac medications before your next appointment, please call your pharmacy*  Lab Work: Your physician recommends that you return for lab work in: 3 months for FASTING lipid/liver panel  If you have labs (blood work) drawn today and your tests are completely normal, you will receive your results only by: MyChart Message (if you have MyChart) OR A paper copy in the mail If you have any lab test that is abnormal or we need to change your treatment, we will call you to review the results.   Follow-Up: At [facility], you and your health needs are our priority.  As part of our continuing mission to provide you with exceptional heart care, our providers are all part of one team.  This team includes your primary Cardiologist (physician) and Advanced Practice Providers or APPs (Physician Assistants and Nurse Practitioners) who all work together to provide you with the care you need, when you need it.  Your next appointment:   12 month(s)  Provider:   Dorn Lesches, MD

## 2024-02-23 NOTE — Assessment & Plan Note (Signed)
 History of CAD status post cardiac catheterization performed by Dr. Swaziland 04/10/2018 revealing moderate branch vessel disease not amenable to PCI.  He is asymptomatic.  Medical therapy was recommended at that time.

## 2024-02-23 NOTE — Progress Notes (Signed)
 02/23/2024 Tanda Hover [facility]   1950-08-30  [id number]  Primary Physician Merna, Darleene, NP Primary Cardiologist: Dorn JINNY Lesches MD [facility] [place], [place]  HPI:  Kyle Ballard is a 73 y.o.  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 I last saw him in the office 02/26/2023.  He has a history of treated hypertension, diabetes and hyperlipidemia.  He smoked for many years but is currently trying to quit.  He had cardiac catheterization performed October 2015 revealing moderate nonobstructive CAD.  Medical therapy was recommended.  Because of accelerated angina he was admitted to the hospital 04/10/2018 and underwent diagnostic cath by Dr. Swaziland revealing moderate branch vessel disease with obstructive lesions lesions and vessels too small for PCI.  His medicines were adjusted, long acting nitrate was added and medical therapy was recommended.     Since I saw him in the office a year ago he continues to do well.  He admits to being somewhat sedentary.  He denies chest pain, shortness of breath or palpitations.   Current Meds  Medication Sig   aspirin  EC 81 MG EC tablet Take 1 tablet (81 mg total) by mouth daily.   carvedilol  (COREG ) 6.25 MG tablet TAKE 1 TABLET BY MOUTH TWICE DAILY WITH A MEAL   CINNAMON PO Take 2 tablets by mouth once as needed.   clopidogrel  (PLAVIX ) 75 MG tablet Take 1 tablet (75 mg total) by mouth daily.   fenofibrate  (TRICOR ) 145 MG tablet Take 1/2 (one-half) tablet by mouth once daily   isosorbide  mononitrate (IMDUR ) 30 MG 24 hr tablet Take 1 tablet by mouth once daily   losartan  (COZAAR ) 100 MG tablet Take 1 tablet (100 mg total) by mouth daily.   metFORMIN  (GLUCOPHAGE ) 500 MG tablet Take 1 tablet (500 mg total) by mouth 2 (two) times daily with a meal.   NITROSTAT  0.4 MG SL tablet DISSOLVE ONE TABLET UNDER THE TONGUE EVERY 5 MINUTES AS NEEDED  FOR CHEST PAIN. (Patient taking differently: Place 0.4 mg under the tongue every 5 (five) minutes as needed for chest pain.)   [DISCONTINUED] atorvastatin  (LIPITOR) 40 MG tablet Take 1 tablet by mouth once daily     Allergies  Allergen Reactions   Lisinopril  Swelling   Benadryl  [Diphenhydramine  Hcl] Other (See Comments)    Not really sure if has allergy; but past concern may have caused lip swelling     Social History   Socioeconomic History   Marital status: Married    Spouse name: Not on file   Number of children: 2   Years of education: Not on file   Highest education level: Some college, no degree  Occupational History   Occupation: retired  Tobacco Use   Smoking status: Former    Current packs/day: 0.00    Types: Cigarettes    Quit date: 08/2019    Years since quitting: 4.5   Smokeless tobacco: Never  Vaping Use   Vaping status: Never Used  Substance and Sexual Activity   Alcohol use: No    Alcohol/week: 0.0 standard drinks of alcohol   Drug use: No   Sexual activity: Not on file  Other Topics Concern   Not on file  Social History Narrative   Works at [employer]; His wife is on custodial staff at [employer]; Son in [place]   Social Drivers of Health   Financial Resource Strain:  Low Risk  (01/12/2024)   Overall Financial Resource Strain (CARDIA)    Difficulty of Paying Living Expenses: Not hard at all  Food Insecurity: No Food Insecurity (01/12/2024)   Hunger Vital Sign    Worried About Running Out of Food in the Last Year: Never true    Ran Out of Food in the Last Year: Never true  Transportation Needs: No Transportation Needs (01/12/2024)   PRAPARE - Administrator, Civil Service (Medical): No    Lack of Transportation (Non-Medical): No  Physical Activity: Insufficiently Active (01/12/2024)   Exercise Vital Sign    Days of Exercise per Week: 3 days    Minutes of Exercise per Session: 20 min  Stress: No Stress Concern Present (01/12/2024)   [employer]  of Occupational Health - Occupational Stress Questionnaire    Feeling of Stress: Not at all  Social Connections: Moderately Isolated (01/12/2024)   Social Connection and Isolation Panel    Frequency of Communication with Friends and Family: More than three times a week    Frequency of Social Gatherings with Friends and Family: More than three times a week    Attends Religious Services: Never    Database administrator or Organizations: No    Attends Banker Meetings: Never    Marital Status: Married  Catering manager Violence: Not At Risk (01/12/2024)   Humiliation, Afraid, Rape, and Kick questionnaire    Fear of Current or Ex-Partner: No    Emotionally Abused: No    Physically Abused: No    Sexually Abused: No     Review of Systems: General: negative for chills, fever, night sweats or weight changes.  Cardiovascular: negative for chest pain, dyspnea on exertion, edema, orthopnea, palpitations, paroxysmal nocturnal dyspnea or shortness of breath Dermatological: negative for rash Respiratory: negative for cough or wheezing Urologic: negative for hematuria Abdominal: negative for nausea, vomiting, diarrhea, bright red blood per rectum, melena, or hematemesis Neurologic: negative for visual changes, syncope, or dizziness All other systems reviewed and are otherwise negative except as noted above.    Blood pressure 100/80, pulse (!) 59, height 5' 10 (1.778 m), weight 230 lb (104.3 kg), SpO2 98%.  General appearance: alert and no distress Neck: no adenopathy, no carotid bruit, no JVD, supple, symmetrical, trachea midline, and thyroid  not enlarged, symmetric, no tenderness/mass/nodules Lungs: clear to auscultation bilaterally Heart: regular rate and rhythm, S1, S2 normal, no murmur, click, rub or gallop Extremities: extremities normal, atraumatic, no cyanosis or edema Pulses: 2+ and symmetric Skin: Skin color, texture, turgor normal. No rashes or lesions Neurologic: Grossly  normal  EKG EKG Interpretation Date/Time:  Monday February 23 2024 10:27:07 EDT Ventricular Rate:  59 PR Interval:  184 QRS Duration:  140 QT Interval:  460 QTC Calculation: 455 R Axis:   -41  Text Interpretation: Sinus bradycardia with frequent Premature ventricular complexes in a pattern of bigeminy Left axis deviation Right bundle branch block When compared with ECG of 26-Feb-2023 10:09, Premature ventricular complexes are now Present Premature supraventricular complexes are no longer Present Confirmed by Court Carrier [phone]) on 02/23/2024 10:29:24 AM    ASSESSMENT AND PLAN:   Hypertension History of essential hypertension with blood pressure measured today at 100/80.  He is on carvedilol  and losartan .  Hyperlipidemia associated with type 2 diabetes mellitus (HCC) History of hyperlipidemia on atorvastatin  40 mg a day with lipid profile performed 09/19/2023 revealing total cholesterol 133, LDL 84 and HDL of 32, not at goal for secondary prevention  given his moderate nonobstructive CAD.  I am going to increase his atorvastatin  from 40 to 80 mg a day and we will recheck a lipid and liver profile in 3 months.  He does admit to dietary indiscretion as well.  Irregular heart rate PVCs on his EKG today although he does not feel these.  Coronary artery disease History of CAD status post cardiac catheterization performed by Dr. Swaziland 04/10/2018 revealing moderate branch vessel disease not amenable to PCI.  He is asymptomatic.  Medical therapy was recommended at that time.     Dorn DOROTHA Lesches MD FACP,FACC,FAHA, [facility] 02/23/2024 10:39 AM

## 2024-02-23 NOTE — Assessment & Plan Note (Signed)
 PVCs on his EKG today although he does not feel these.

## 2024-02-23 NOTE — Assessment & Plan Note (Signed)
 History of essential hypertension with blood pressure measured today at 100/80.  He is on carvedilol  and losartan .

## 2024-02-23 NOTE — Assessment & Plan Note (Signed)
 History of hyperlipidemia on atorvastatin  40 mg a day with lipid profile performed 09/19/2023 revealing total cholesterol 133, LDL 84 and HDL of 32, not at goal for secondary prevention given his moderate nonobstructive CAD.  I am going to increase his atorvastatin  from 40 to 80 mg a day and we will recheck a lipid and liver profile in 3 months.  He does admit to dietary indiscretion as well.

## 2024-03-14 ENCOUNTER — Other Ambulatory Visit: Payer: Self-pay | Admitting: Cardiovascular Disease

## 2024-03-29 ENCOUNTER — Other Ambulatory Visit: Payer: Self-pay | Admitting: Cardiovascular Disease

## 2024-04-19 ENCOUNTER — Other Ambulatory Visit: Payer: Self-pay | Admitting: Cardiovascular Disease

## 2024-04-19 DIAGNOSIS — I10 Essential (primary) hypertension: Secondary | ICD-10-CM

## 2024-04-19 DIAGNOSIS — E1169 Type 2 diabetes mellitus with other specified complication: Secondary | ICD-10-CM

## 2024-04-23 NOTE — Progress Notes (Signed)
" ° °  04/23/2024  Patient ID: Kyle Ballard, male   DOB: 1950-07-20, 73 y.o.   MRN: [id number]  Pharmacy Quality Measure Review  This patient is appearing on a report for being at risk of failing the adherence measure for hypertension (ACEi/ARB) medications this calendar year.   Medication: Losartan  Last fill date: 04/19/24 for 90 day supply  Insurance report was not up to date. No action needed at this time.   Jon VEAR Lindau, PharmD Clinical Pharmacist [phone]   "

## 2024-05-12 ENCOUNTER — Ambulatory Visit: Admitting: Adult Health

## 2024-05-12 ENCOUNTER — Encounter: Payer: Self-pay | Admitting: Adult Health

## 2024-05-12 VITALS — BP 122/84 | Temp 97.8°F | Ht 70.0 in | Wt 229.0 lb

## 2024-05-12 DIAGNOSIS — L505 Cholinergic urticaria: Secondary | ICD-10-CM | POA: Diagnosis not present

## 2024-05-12 DIAGNOSIS — E119 Type 2 diabetes mellitus without complications: Secondary | ICD-10-CM | POA: Diagnosis not present

## 2024-05-12 DIAGNOSIS — I10 Essential (primary) hypertension: Secondary | ICD-10-CM

## 2024-05-12 DIAGNOSIS — Z7984 Long term (current) use of oral hypoglycemic drugs: Secondary | ICD-10-CM

## 2024-05-12 LAB — POCT GLYCOSYLATED HEMOGLOBIN (HGB A1C): Hemoglobin A1C: 6.2 % — AB (ref 4.0–5.6)

## 2024-05-12 MED ORDER — METFORMIN HCL 500 MG PO TABS: 500.0000 mg | ORAL_TABLET | Freq: Two times a day (BID) | ORAL | 1 refills | Status: AC

## 2024-05-12 MED ORDER — LORATADINE 10 MG PO TABS: 10.0000 mg | ORAL_TABLET | Freq: Every day | ORAL | 3 refills | Status: AC
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
## 2024-05-12 NOTE — Patient Instructions (Signed)
 There are no preventive care reminders to display for this patient.     01/12/2024    9:20 AM 09/19/2023    8:18 AM 09/17/2022    8:57 AM  Depression screen PHQ 2/9  Decreased Interest 0 0 0  Down, Depressed, Hopeless 0 0 0  PHQ - 2 Score 0 0 0  Altered sleeping   0  Tired, decreased energy   0  Change in appetite   0  Feeling bad or failure about yourself    0  Trouble concentrating   0  Moving slowly or fidgety/restless   0  Suicidal thoughts   0  PHQ-9 Score   0      Data saved with a previous flowsheet row definition

## 2024-05-23 ENCOUNTER — Other Ambulatory Visit: Payer: Self-pay | Admitting: Cardiovascular Disease

## 2024-05-24 LAB — LIPID PANEL
Chol/HDL Ratio: 3.6 ratio (ref 0.0–5.0)
Cholesterol, Total: 107 mg/dL (ref 100–199)
HDL: 30 mg/dL — ABNORMAL LOW
LDL Chol Calc (NIH): 60 mg/dL (ref 0–99)
Triglycerides: 88 mg/dL (ref 0–149)
VLDL Cholesterol Cal: 17 mg/dL (ref 5–40)

## 2024-05-24 LAB — HEPATIC FUNCTION PANEL
ALT: 18 IU/L (ref 0–44)
AST: 19 IU/L (ref 0–40)
Albumin: 4.3 g/dL (ref 3.8–4.8)
Alkaline Phosphatase: 67 IU/L (ref 47–123)
Bilirubin Total: 0.5 mg/dL (ref 0.0–1.2)
Bilirubin, Direct: 0.17 mg/dL (ref 0.00–0.40)
Total Protein: 7.1 g/dL (ref 6.0–8.5)

## 2024-05-25 ENCOUNTER — Ambulatory Visit: Payer: Self-pay | Admitting: Cardiovascular Disease

## 2025-01-17 ENCOUNTER — Ambulatory Visit
